# Patient Record
Sex: Male | Born: 1960
Health system: Southern US, Community
[De-identification: ages and names within clinical notes are randomized; demographics above are authoritative.]

## PROBLEM LIST (undated history)

## (undated) DIAGNOSIS — Z973 Presence of spectacles and contact lenses: Secondary | ICD-10-CM

## (undated) DIAGNOSIS — K5730 Diverticulosis of large intestine without perforation or abscess without bleeding: Secondary | ICD-10-CM

## (undated) DIAGNOSIS — E785 Hyperlipidemia, unspecified: Secondary | ICD-10-CM

## (undated) DIAGNOSIS — K219 Gastro-esophageal reflux disease without esophagitis: Secondary | ICD-10-CM

## (undated) DIAGNOSIS — I491 Atrial premature depolarization: Secondary | ICD-10-CM

## (undated) DIAGNOSIS — Z9889 Other specified postprocedural states: Secondary | ICD-10-CM

## (undated) DIAGNOSIS — Z85828 Personal history of other malignant neoplasm of skin: Secondary | ICD-10-CM

## (undated) DIAGNOSIS — Z136 Encounter for screening for cardiovascular disorders: Secondary | ICD-10-CM

## (undated) DIAGNOSIS — Z8719 Personal history of other diseases of the digestive system: Secondary | ICD-10-CM

## (undated) DIAGNOSIS — K4091 Unilateral inguinal hernia, without obstruction or gangrene, recurrent: Secondary | ICD-10-CM

## (undated) HISTORY — PX: COLONOSCOPY: SHX174

## (undated) HISTORY — DX: Gastro-esophageal reflux disease without esophagitis: K21.9

---

## 1998-01-11 HISTORY — PX: INGUINAL HERNIA REPAIR: SUR1180

## 2004-02-28 DIAGNOSIS — J018 Other acute sinusitis: Secondary | ICD-10-CM | POA: Insufficient documentation

## 2007-08-23 ENCOUNTER — Encounter: Payer: Self-pay | Admitting: Gastroenterology

## 2007-09-15 ENCOUNTER — Ambulatory Visit: Payer: Self-pay | Admitting: Gastroenterology

## 2007-09-29 ENCOUNTER — Encounter: Payer: Self-pay | Admitting: Gastroenterology

## 2007-09-29 ENCOUNTER — Ambulatory Visit: Payer: Self-pay | Admitting: Gastroenterology

## 2007-10-03 ENCOUNTER — Encounter: Payer: Self-pay | Admitting: Gastroenterology

## 2009-01-31 ENCOUNTER — Emergency Department (HOSPITAL_COMMUNITY): Admission: EM | Admit: 2009-01-31 | Discharge: 2009-01-31 | Payer: Self-pay | Admitting: Emergency Medicine

## 2009-08-26 ENCOUNTER — Ambulatory Visit: Payer: Self-pay | Admitting: Internal Medicine

## 2009-08-26 ENCOUNTER — Encounter: Payer: Self-pay | Admitting: Internal Medicine

## 2009-08-26 DIAGNOSIS — Z860100 Personal history of colon polyps, unspecified: Secondary | ICD-10-CM | POA: Insufficient documentation

## 2009-08-26 DIAGNOSIS — K219 Gastro-esophageal reflux disease without esophagitis: Secondary | ICD-10-CM | POA: Insufficient documentation

## 2009-08-26 LAB — CONVERTED CEMR LAB
BUN: 16 mg/dL (ref 6–23)
Basophils Relative: 0.5 % (ref 0.0–3.0)
CO2: 33 meq/L — ABNORMAL HIGH (ref 19–32)
Chloride: 101 meq/L (ref 96–112)
Direct LDL: 165.8 mg/dL
Eosinophils Absolute: 0.1 10*3/uL (ref 0.0–0.7)
Eosinophils Relative: 1.9 % (ref 0.0–5.0)
HDL: 47.3 mg/dL (ref >39.00)
Hemoglobin: 16.3 g/dL (ref 13.0–17.0)
Lymphocytes Relative: 26 % (ref 12.0–46.0)
Monocytes Relative: 5.1 % (ref 3.0–12.0)
Neutro Abs: 4.8 10*3/uL (ref 1.4–7.7)
Neutrophils Relative %: 66.5 % (ref 43.0–77.0)
Potassium: 4.8 meq/L (ref 3.5–5.1)
RBC: 5.2 M/uL (ref 4.22–5.81)
Total CHOL/HDL Ratio: 5
VLDL: 17 mg/dL (ref 0.0–40.0)
WBC: 7.3 10*3/uL (ref 4.5–10.5)

## 2010-02-10 NOTE — Letter (Signed)
Summary: Lipid Letter  [facility]   [place], [place] [address]   Phone: [phone]  Fax: [phone]    08/26/2009  Valin Massie [address] [place], [place]  [address]  Dear Alvester Chou:  We have carefully reviewed your last lipid profile from  and the results are noted below with a summary of recommendations for lipid management.    Cholesterol:       225     Goal: <200   HDL "good" Cholesterol:   47.30     Goal: >40 good news   LDL "bad" Cholesterol:   166     Goal: <130 bad news   Triglycerides:       85.0     Goal: <150    other labs look good, let me know if you want to start a cholesterol medicine    TLC Diet (Therapeutic Lifestyle Change): Saturated Fats & Transfatty acids should be kept < 7% of total calories ***Reduce Saturated Fats Polyunstaurated Fat can be up to 10% of total calories Monounsaturated Fat Fat can be up to 20% of total calories Total Fat should be no greater than 25-35% of total calories Carbohydrates should be 50-60% of total calories Protein should be approximately 15% of total calories Fiber should be at least 20-30 grams a day ***Increased fiber may help lower LDL Total Cholesterol should be < 258m/day Consider adding plant stanol/sterols to diet (example: Benacol spread) ***A higher intake of unsaturated fat may reduce Triglycerides and Increase HDL    Adjunctive Measures (may lower LIPIDS and reduce risk of Heart Attack) include: Aerobic Exercise (20-30 minutes 3-4 times a week) Limit Alcohol Consumption Weight Reduction Aspirin 75-81 mg a day by mouth (if not allergic or contraindicated) Dietary Fiber 20-30 grams a day by mouth     Current Medications: 1)    Asacol 400 Mg Tbec (Mesalamine) .... Take 1 tablet by mouth once a day 2)    Dexilant 60 Mg Cpdr (Dexlansoprazole) .... One by mouth once daily for heartburn  If you have any questions, please call. We appreciate being able to work with you.   Sincerely,      [facility] TJanith LimaMD

## 2010-02-10 NOTE — Assessment & Plan Note (Signed)
Summary: NEW PT CPX / LABS SAME DAY / NWS  #   Vital Signs:  Patient profile:   50 year old male Height:      73 inches Weight:      187.25 pounds BMI:     24.79 O2 Sat:      96 % on Room air Temp:     97.1 degrees F oral Pulse rate:   65 / minute Pulse rhythm:   regular Resp:     16 per minute BP sitting:   112 / 70  (left arm) Cuff size:   large  Vitals Entered By: Estell Harpin CMA (August 26, 2009 10:33 AM)  O2 Flow:  Room air CC: New pt CPX w/ labs, Heartburn Is Patient Diabetic? No Pain Assessment Patient in pain? no       Does patient need assistance? Functional Status Self care Ambulation Normal   Primary Care [facility]:  Janith Lima MD  CC:  New pt CPX w/ labs and Heartburn.  History of Present Illness:  Heartburn      This is a 49 year old man who presents with Heartburn.  The symptoms began 4-8 weeks ago.  The intensity is described as mild.  The patient reports acid reflux, but denies sour taste in mouth, epigastric pain, chest pain, trouble swallowing, weight loss, and weight gain.  The patient denies the following alarm features: melena, dysphagia, hematemesis, vomiting, involuntary weight loss >5%, and history of anemia.  Symptoms are worse with spicy foods.  Prior evaluation has included no diagnostic studies.  Treatment that was tried and either found to be ineffective or stopped due to problems include dietary changes and an antacid.    Preventive Screening-Counseling & Management  Alcohol-Tobacco     Alcohol drinks/day: <1     Alcohol type: all     >5/day in last 3 mos: no     Alcohol Counseling: not indicated; use of alcohol is not excessive or problematic     Feels need to cut down: no     Feels annoyed by complaints: no     Feels guilty re: drinking: no     Needs 'eye opener' in am: no     Smoking Status: never  Caffeine-Diet-Exercise     Does Patient Exercise: yes  Hep-HIV-STD-Contraception     Hepatitis Risk: no risk noted  HIV Risk: no risk noted     STD Risk: no risk noted     TSE monthly: yes     Testicular SE Education/Counseling to perform regular STE  Safety-Violence-Falls     Seat Belt Use: yes     Helmet Use: yes     Firearms in the Home: no firearms in the home     Smoke Detectors: yes     Violence in the Home: no risk noted     Sexual Abuse: no      Sexual History:  currently monogamous.        Drug Use:  never and no.        Blood Transfusions:  no.    Medications Prior to Update: 1)  Miralax   Powd (Polyethylene Glycol 3350) .... As  Directed 2)  Reglan 10 Mg  Tabs (Metoclopramide Hcl) .... As Directed 3)  Dulcolax 5 Mg  Tbec (Bisacodyl) .... As Directed  Current Medications (verified): 1)  Asacol 400 Mg Tbec (Mesalamine) .... Take 1 Tablet By Mouth Once A Day 2)  Dexilant 60 Mg Cpdr (Dexlansoprazole) .... One  By Mouth Once Daily For Heartburn  Allergies (verified): No Known Drug Allergies  Past History:  Past Medical History: Colonic polyps, hx of Ulcerative colitis  Past Surgical History: Denies surgical history  Family History: father had CABG at 10 yoa and he had leukemia  Social History: Alcohol use-yes Drug use-no Regular exercise-yes Separated Occupation: CFO Drug Use:  never, no Does Patient Exercise:  yes Education:  Administrator, arts Use:  yes Alcohol:  Less than 3 drinks per week Smoking Status:  never Hepatitis Risk:  no risk noted HIV Risk:  no risk noted STD Risk:  no risk noted Sexual History:  currently monogamous Blood Transfusions:  no  Review of Systems       The patient complains of severe indigestion/heartburn.  The patient denies anorexia, fever, weight loss, weight gain, chest pain, syncope, dyspnea on exertion, peripheral edema, prolonged cough, headaches, hemoptysis, abdominal pain, melena, hematochezia, hematuria, muscle weakness, suspicious skin lesions, difficulty walking, depression, abnormal bleeding, enlarged lymph nodes,  angioedema, and testicular masses.   GI:  Complains of gas; denies abdominal pain, bloody stools, change in bowel habits, constipation, dark tarry stools, diarrhea, excessive appetite, hemorrhoids, indigestion, loss of appetite, nausea, vomiting, vomiting blood, and yellowish skin color.  Physical Exam  General:  alert, well-developed, well-nourished, well-hydrated, appropriate dress, normal appearance, healthy-appearing, cooperative to examination, and good hygiene.   Head:  normocephalic, atraumatic, no abnormalities observed, no abnormalities palpated, and no alopecia.   Eyes:  vision grossly intact, pupils equal, pupils round, and pupils reactive to light.   Ears:  R ear normal and L ear normal.   Nose:  External nasal examination shows no deformity or inflammation. Nasal mucosa are pink and moist without lesions or exudates. Mouth:  Oral mucosa and oropharynx without lesions or exudates.  Teeth in good repair. Neck:  supple, full ROM, no masses, no thyromegaly, no thyroid nodules or tenderness, no JVD, normal carotid upstroke, no carotid bruits, no cervical lymphadenopathy, and no neck tenderness.   Lungs:  normal respiratory effort, no intercostal retractions, no accessory muscle use, normal breath sounds, no dullness, no fremitus, no crackles, and no wheezes.   Heart:  normal rate, regular rhythm, no murmur, no gallop, no rub, and no JVD.   Abdomen:  soft, non-tender, normal bowel sounds, no distention, no masses, no guarding, no rigidity, no rebound tenderness, no abdominal hernia, no inguinal hernia, no hepatomegaly, and no splenomegaly.   Genitalia:  circumcised, no hydrocele, no varicocele, no scrotal masses, no testicular masses or atrophy, no cutaneous lesions, and no urethral discharge.   Msk:  normal ROM, no joint tenderness, no joint swelling, no joint warmth, no redness over joints, no joint deformities, no joint instability, no crepitation, and no muscle atrophy.   Pulses:  R and L  carotid,radial,femoral,dorsalis pedis and posterior tibial pulses are full and equal bilaterally Extremities:  No clubbing, cyanosis, edema, or deformity noted with normal full range of motion of all joints.   Neurologic:  No cranial nerve deficits noted. Station and gait are normal. Plantar reflexes are down-going bilaterally. DTRs are symmetrical throughout. Sensory, motor and coordinative functions appear intact. Skin:  turgor normal, color normal, no rashes, no suspicious lesions, no ecchymoses, no petechiae, no purpura, no ulcerations, and no edema.   Cervical Nodes:  no anterior cervical adenopathy and no posterior cervical adenopathy.   Axillary Nodes:  no R axillary adenopathy and no L axillary adenopathy.   Inguinal Nodes:  no R inguinal adenopathy and no L inguinal  adenopathy.   Psych:  Cognition and judgment appear intact. Alert and cooperative with normal attention span and concentration. No apparent delusions, illusions, hallucinations   Impression & Recommendations:  Problem # 1:  GERD (ICD-530.81) Assessment New  His updated medication list for this problem includes:    Dexilant 60 Mg Cpdr (Dexlansoprazole) ..... One by mouth once daily for heartburn  Orders: Venipuncture (38937) TLB-Lipid Panel (80061-LIPID) TLB-CBC Platelet - w/Differential (85025-CBCD) TLB-BMP (Basic Metabolic Panel-BMET) (34287-GOTLXBW)  Problem # 2:  ROUTINE GENERAL MEDICAL EXAM@HEALTH  CARE FACL (ICD-V70.0)  Orders: Venipuncture (62035) TLB-Lipid Panel (80061-LIPID) TLB-CBC Platelet - w/Differential (85025-CBCD) TLB-BMP (Basic Metabolic Panel-BMET) (59741-ULAGTXM) EKG w/ Interpretation (93000)  Colonoscopy: Location:  [facility].   (09/29/2007) Next Colonoscopy due:: 10/2010 (09/29/2007)  Discussed using sunscreen, use of alcohol, drug use, self testicular exam, routine dental care, routine eye care, routine physical exam, seat belts, multiple vitamins, and recommendations for  immunizations.  Discussed exercise and checking cholesterol.    Complete Medication List: 1)  Asacol 400 Mg Tbec (Mesalamine) .... Take 1 tablet by mouth once a day 2)  Dexilant 60 Mg Cpdr (Dexlansoprazole) .... One by mouth once daily for heartburn  Patient Instructions: 1)  Please schedule a follow-up appointment in 1 month. 2)  Avoid foods high in acid (tomatoes, citrus juices, spicy foods). Avoid eating within two hours of lying down or before exercising. Do not over eat; try smaller more frequent meals. Elevate head of bed twelve inches when sleeping. Prescriptions: DEXILANT 60 MG CPDR (DEXLANSOPRAZOLE) One by mouth once daily for heartburn  #15 x 0   Entered and Authorized by:   Janith Lima MD   Signed by:   Janith Lima MD on 08/26/2009   Method used:   Samples Given   RxID:   [id number]

## 2010-03-30 ENCOUNTER — Ambulatory Visit: Payer: Self-pay | Admitting: Internal Medicine

## 2010-04-02 ENCOUNTER — Encounter: Payer: Self-pay | Admitting: Internal Medicine

## 2010-04-02 ENCOUNTER — Ambulatory Visit (INDEPENDENT_AMBULATORY_CARE_PROVIDER_SITE_OTHER): Payer: BC Managed Care – PPO | Admitting: Internal Medicine

## 2010-04-02 ENCOUNTER — Other Ambulatory Visit (INDEPENDENT_AMBULATORY_CARE_PROVIDER_SITE_OTHER): Payer: BC Managed Care – PPO | Admitting: Internal Medicine

## 2010-04-02 ENCOUNTER — Other Ambulatory Visit (INDEPENDENT_AMBULATORY_CARE_PROVIDER_SITE_OTHER): Payer: BC Managed Care – PPO

## 2010-04-02 VITALS — BP 118/72 | HR 68 | Temp 98.5°F | Resp 14 | Wt 192.0 lb

## 2010-04-02 DIAGNOSIS — R5383 Other fatigue: Secondary | ICD-10-CM | POA: Insufficient documentation

## 2010-04-02 DIAGNOSIS — K5090 Crohn's disease, unspecified, without complications: Secondary | ICD-10-CM

## 2010-04-02 DIAGNOSIS — K219 Gastro-esophageal reflux disease without esophagitis: Secondary | ICD-10-CM

## 2010-04-02 DIAGNOSIS — R5381 Other malaise: Secondary | ICD-10-CM

## 2010-04-02 LAB — CBC WITH DIFFERENTIAL/PLATELET
Basophils Relative: 1 % (ref 0.0–3.0)
Eosinophils Absolute: 0.2 10*3/uL (ref 0.0–0.7)
Hemoglobin: 14.9 g/dL (ref 13.0–17.0)
MCHC: 34.8 g/dL (ref 30.0–36.0)
MCV: 90.1 fl (ref 78.0–100.0)
Monocytes Absolute: 0.4 10*3/uL (ref 0.1–1.0)
Neutro Abs: 5.2 10*3/uL (ref 1.4–7.7)
RBC: 4.76 Mil/uL (ref 4.22–5.81)

## 2010-04-02 LAB — COMPREHENSIVE METABOLIC PANEL
AST: 18 U/L (ref 0–37)
Alkaline Phosphatase: 54 U/L (ref 39–117)
BUN: 15 mg/dL (ref 6–23)
Calcium: 9 mg/dL (ref 8.4–10.5)
Chloride: 100 mEq/L (ref 96–112)
Creatinine, Ser: 1 mg/dL (ref 0.4–1.5)

## 2010-04-02 LAB — SEDIMENTATION RATE: Sed Rate: 10 mm/hr (ref 0–22)

## 2010-04-02 LAB — TSH: TSH: 1.39 u[IU]/mL (ref 0.35–5.50)

## 2010-04-02 MED ORDER — OMEPRAZOLE 40 MG PO CPDR: 40.0000 mg | DELAYED_RELEASE_CAPSULE | Freq: Every day | ORAL | Status: DC

## 2010-04-02 MED ORDER — MESALAMINE 400 MG PO TBEC: 400.0000 mg | DELAYED_RELEASE_TABLET | Freq: Every day | ORAL | Status: DC

## 2010-04-02 NOTE — Progress Notes (Signed)
  Subjective:    Patient ID: Daniel Allen, male    DOB: 1960-10-24, 50 y.o.   MRN: [id number]  HPI He returns c/o one week history of belching and abd bloating, he has not been taking his meds for several months. He has not had any diarrhea or bloody stools.    Review of Systems  Constitutional: Positive for fatigue. Negative for fever, chills, diaphoresis, activity change, appetite change and unexpected weight change.  HENT: Negative for neck pain.   Respiratory: Negative for cough, choking, shortness of breath, wheezing and stridor.   Cardiovascular: Negative for chest pain, palpitations and leg swelling.  Gastrointestinal: Negative for nausea, abdominal pain, diarrhea, constipation, blood in stool, abdominal distention, anal bleeding and rectal pain.  Genitourinary: Negative for dysuria, urgency, frequency, hematuria, decreased urine volume, difficulty urinating and penile pain.  Musculoskeletal: Negative for myalgias, joint swelling and arthralgias.  Skin: Negative for color change and pallor.  Neurological: Negative for dizziness, tremors, weakness, light-headedness, numbness and headaches.  Hematological: Negative for adenopathy. Does not bruise/bleed easily.  Psychiatric/Behavioral: Negative for hallucinations, behavioral problems, confusion, dysphoric mood, decreased concentration and agitation.     Past Medical History  Diagnosis Date  . Ulcerative colitis   . Hx of colonic polyps    No past surgical history on file.  reports that he has never smoked. He does not have any smokeless tobacco history on file. He reports that he drinks alcohol. He reports that he does not use illicit drugs. family history includes Cancer in his father. No Known Allergies     Objective:   Physical Exam  Constitutional: He is oriented to person, place, and time. He appears well-developed and well-nourished. No distress.  HENT:  Head: Atraumatic.  Right Ear: External ear normal.  Left Ear:  External ear normal.  Nose: Nose normal.  Mouth/Throat: No oropharyngeal exudate.  Eyes: Conjunctivae and EOM are normal. Pupils are equal, round, and reactive to light. Right eye exhibits no discharge. Left eye exhibits no discharge. No scleral icterus.  Neck: Normal range of motion. Neck supple. No thyromegaly present.  Cardiovascular: Normal rate, regular rhythm and intact distal pulses.  Exam reveals no gallop and no friction rub.   No murmur heard. Pulmonary/Chest: Effort normal and breath sounds normal. No respiratory distress. He has no wheezes. He has no rales. He exhibits no tenderness.  Abdominal: He exhibits no distension and no mass. There is no tenderness. There is no rebound and no guarding.  Musculoskeletal: He exhibits no edema and no tenderness.  Lymphadenopathy:    He has no cervical adenopathy.  Neurological: He is oriented to person, place, and time. He has normal reflexes. No cranial nerve deficit. He exhibits normal muscle tone. Coordination normal.  Skin: Skin is warm and dry. No rash noted. He is not diaphoretic. No erythema. No pallor.  Psychiatric: He has a normal mood and affect. His behavior is normal. Judgment and thought content normal.          Assessment & Plan:

## 2010-04-02 NOTE — Patient Instructions (Signed)
Esophagitis (Heartburn) Esophagitis (heartburn) is a painful, burning sensation in the chest. It may feel worse in certain positions, such as lying down or bending over. It is caused by stomach acid backing up into the tube that carries food from the mouth down to the stomach (lower esophagus). TREATMENT There are a number of non-prescription medicines used to treat heartburn, including:  Antacids.   Acid reducers (also called H-2 blockers).   Proton-pump inhibitors.  HOME CARE INSTRUCTIONS  Raise the head of your bead by putting blocks under the legs.   Eat 2-3 hours before going to bed.   Stop smoking.   Try to reach and maintain a healthy weight.   Do not eat just a few very large meals. Instead, eat many smaller meals throughout the day.   Try to identify foods and beverages that make your symptoms worse, and avoid these.   Avoid tight clothing.   Do not exercise right after eating.  SEEK IMMEDIATE MEDICAL CARE IF YOU:  Have severe chest pain that goes down your arm, or into your jaw or neck.   Feel sweaty, dizzy, or lightheaded.   Are short of breath.   Throw up (vomit) blood.   Have difficulty or pain with swallowing.   Have bloody or black, tarry stools.   Have bouts of heartburn more than three times a week for more than two weeks.  Document Released: 02/05/2004 Document Re-Released: 03/24/2009 [facility] Patient Information 2011 [place].

## 2010-04-03 ENCOUNTER — Encounter: Payer: Self-pay | Admitting: Internal Medicine

## 2010-05-15 ENCOUNTER — Ambulatory Visit: Payer: BC Managed Care – PPO | Admitting: Gastroenterology

## 2010-09-07 ENCOUNTER — Other Ambulatory Visit: Payer: Self-pay | Admitting: Internal Medicine

## 2010-10-19 ENCOUNTER — Telehealth: Payer: Self-pay | Admitting: Gastroenterology

## 2010-10-19 NOTE — Telephone Encounter (Signed)
Pt was due for colon recall, Dr. Deatra Ina reviewed the chart and states that the pt no longer needs the colon at this time. Pt is calling wanting to know why he doesn't need the colon now. Dr. Deatra Ina please advise.

## 2010-10-20 NOTE — Telephone Encounter (Signed)
Pt is having bleeding. Therefore proceed with scheduling colo

## 2010-10-20 NOTE — Telephone Encounter (Signed)
Spoke with pt and he states he is not having any bleeding. He was under the impression he was supposed to have a colon every 3 years. Last colon done 09/29/07. Pt wants to know if he no longer needs the colon q 3 years.........Marland KitchenIf not he would like to know why.  Please advise.

## 2010-10-21 NOTE — Telephone Encounter (Signed)
Pt scheduled for previsit for 11/04/10@8 :30am, Colon scheduled with Dr. Deatra Ina for 11/06/10@1 :30pm. Pt aware of appt dates and times.

## 2010-10-21 NOTE — Telephone Encounter (Signed)
With h/o ulcerative colitis surveillance colonoscopy is recommended b/c of increased risk for colon cancer. Rec f/u colo now

## 2010-10-21 NOTE — Telephone Encounter (Signed)
Left message for pt to call back  °

## 2010-11-04 ENCOUNTER — Ambulatory Visit (AMBULATORY_SURGERY_CENTER): Payer: BC Managed Care – PPO | Admitting: *Deleted

## 2010-11-04 ENCOUNTER — Other Ambulatory Visit: Payer: BC Managed Care – PPO | Admitting: Gastroenterology

## 2010-11-04 DIAGNOSIS — K5190 Ulcerative colitis, unspecified, without complications: Secondary | ICD-10-CM

## 2010-11-04 MED ORDER — PEG-KCL-NACL-NASULF-NA ASC-C 100 G PO SOLR: 1.0000 | Freq: Once | ORAL | Status: DC

## 2010-11-04 NOTE — Progress Notes (Signed)
While going over prep instructions, pt  States he needs to change appointment date.  He would be unable to prep on 11-05-10 (day before his procedure).  Appointment changed to 11-19-10 at 2:00 p.m.  Prep instructions given with new date and time and understanding voiced

## 2010-11-06 ENCOUNTER — Other Ambulatory Visit: Payer: BC Managed Care – PPO | Admitting: Gastroenterology

## 2010-11-19 ENCOUNTER — Encounter: Payer: Self-pay | Admitting: Gastroenterology

## 2010-11-19 ENCOUNTER — Ambulatory Visit (AMBULATORY_SURGERY_CENTER): Payer: BC Managed Care – PPO | Admitting: Gastroenterology

## 2010-11-19 VITALS — BP 134/87 | HR 56 | Temp 97.5°F | Resp 18 | Ht 73.0 in | Wt 193.0 lb

## 2010-11-19 DIAGNOSIS — Z86010 Personal history of colonic polyps: Secondary | ICD-10-CM

## 2010-11-19 DIAGNOSIS — K5090 Crohn's disease, unspecified, without complications: Secondary | ICD-10-CM

## 2010-11-19 DIAGNOSIS — K5289 Other specified noninfective gastroenteritis and colitis: Secondary | ICD-10-CM

## 2010-11-19 DIAGNOSIS — K5190 Ulcerative colitis, unspecified, without complications: Secondary | ICD-10-CM

## 2010-11-19 LAB — HM COLONOSCOPY

## 2010-11-19 MED ORDER — SODIUM CHLORIDE 0.9 % IV SOLN: 500.0000 mL | INTRAVENOUS | Status: DC

## 2010-11-19 NOTE — Patient Instructions (Signed)
Resume medications. Information given on diverticulosis,proctitis. D/C instructions completed. Call office to schedule follow appt. With Dr. Deatra Ina for 4 weeks.

## 2010-11-20 ENCOUNTER — Telehealth: Payer: Self-pay | Admitting: *Deleted

## 2010-11-20 NOTE — Telephone Encounter (Signed)

## 2010-12-17 ENCOUNTER — Encounter: Payer: Self-pay | Admitting: Gastroenterology

## 2010-12-17 ENCOUNTER — Ambulatory Visit (INDEPENDENT_AMBULATORY_CARE_PROVIDER_SITE_OTHER): Payer: BC Managed Care – PPO | Admitting: Gastroenterology

## 2010-12-17 VITALS — BP 100/76 | HR 60 | Ht 73.0 in | Wt 191.2 lb

## 2010-12-17 DIAGNOSIS — K5090 Crohn's disease, unspecified, without complications: Secondary | ICD-10-CM

## 2010-12-17 NOTE — Assessment & Plan Note (Addendum)
He remains in clinical remission on maintenance asacol. Recommendations #1 continuous cycle #2 yearly colonoscopy because of the higher risk for colon cancer in the face of 15 year history of Crohn's colitis

## 2010-12-17 NOTE — Progress Notes (Signed)
History of Present Illness:  Daniel Allen returned following colonoscopy for Crohn's colitis. He had Crohn's disease for over 15 years and has remained in remission with asacol. He underwent surveillance colonoscopy recently for colorectal cancer screeing. Biopsies demonstrated chronic inflammatory changes only. He has no GI complaints.    Review of Systems: Pertinent positive and negative review of systems were noted in the above HPI section. All other review of systems were otherwise negative.    Current Medications, Allergies, Past Medical History, Past Surgical History, Family History and Social History were reviewed in [facility] record  Vital signs were reviewed in today's medical record. Physical Exam: General: Well developed , well nourished, no acute distress

## 2010-12-17 NOTE — Patient Instructions (Signed)
Follow up as needed

## 2011-01-06 ENCOUNTER — Other Ambulatory Visit: Payer: Self-pay | Admitting: Internal Medicine

## 2011-03-01 ENCOUNTER — Other Ambulatory Visit: Payer: Self-pay | Admitting: Internal Medicine

## 2011-08-16 ENCOUNTER — Ambulatory Visit: Payer: BC Managed Care – PPO | Admitting: Internal Medicine

## 2011-08-16 DIAGNOSIS — Z0289 Encounter for other administrative examinations: Secondary | ICD-10-CM

## 2011-08-18 ENCOUNTER — Encounter: Payer: Self-pay | Admitting: Internal Medicine

## 2011-08-18 ENCOUNTER — Ambulatory Visit (INDEPENDENT_AMBULATORY_CARE_PROVIDER_SITE_OTHER): Payer: BC Managed Care – PPO | Admitting: Internal Medicine

## 2011-08-18 ENCOUNTER — Other Ambulatory Visit (INDEPENDENT_AMBULATORY_CARE_PROVIDER_SITE_OTHER): Payer: BC Managed Care – PPO

## 2011-08-18 VITALS — BP 100/70 | HR 69 | Temp 97.9°F | Resp 16 | Wt 189.0 lb

## 2011-08-18 DIAGNOSIS — Z23 Encounter for immunization: Secondary | ICD-10-CM

## 2011-08-18 DIAGNOSIS — IMO0001 Reserved for inherently not codable concepts without codable children: Secondary | ICD-10-CM

## 2011-08-18 DIAGNOSIS — Z0000 Encounter for general adult medical examination without abnormal findings: Secondary | ICD-10-CM

## 2011-08-18 DIAGNOSIS — R5383 Other fatigue: Secondary | ICD-10-CM

## 2011-08-18 DIAGNOSIS — Z136 Encounter for screening for cardiovascular disorders: Secondary | ICD-10-CM

## 2011-08-18 DIAGNOSIS — I491 Atrial premature depolarization: Secondary | ICD-10-CM

## 2011-08-18 DIAGNOSIS — R5381 Other malaise: Secondary | ICD-10-CM

## 2011-08-18 DIAGNOSIS — R079 Chest pain, unspecified: Secondary | ICD-10-CM | POA: Insufficient documentation

## 2011-08-18 LAB — TSH: TSH: 1.92 u[IU]/mL (ref 0.35–5.50)

## 2011-08-18 LAB — URINALYSIS, ROUTINE W REFLEX MICROSCOPIC
Bilirubin Urine: NEGATIVE
Hgb urine dipstick: NEGATIVE
Ketones, ur: NEGATIVE
Leukocytes, UA: NEGATIVE
Nitrite: NEGATIVE
Urobilinogen, UA: 0.2 (ref 0.0–1.0)

## 2011-08-18 LAB — CBC WITH DIFFERENTIAL/PLATELET
Basophils Absolute: 0 10*3/uL (ref 0.0–0.1)
Eosinophils Absolute: 0.2 10*3/uL (ref 0.0–0.7)
HCT: 46.9 % (ref 39.0–52.0)
Hemoglobin: 15.8 g/dL (ref 13.0–17.0)
Lymphs Abs: 1.2 10*3/uL (ref 0.7–4.0)
MCHC: 33.6 g/dL (ref 30.0–36.0)
MCV: 91 fl (ref 78.0–100.0)
Monocytes Absolute: 0.3 10*3/uL (ref 0.1–1.0)
Neutro Abs: 4.9 10*3/uL (ref 1.4–7.7)
RDW: 13.1 % (ref 11.5–14.6)

## 2011-08-18 LAB — CARDIAC PANEL
CK-MB: 1.6 ng/mL (ref 0.3–4.0)
Relative Index: 0.9 calc (ref 0.0–2.5)

## 2011-08-18 LAB — COMPREHENSIVE METABOLIC PANEL
ALT: 20 U/L (ref 0–53)
AST: 23 U/L (ref 0–37)
Creatinine, Ser: 0.9 mg/dL (ref 0.4–1.5)
Total Bilirubin: 0.9 mg/dL (ref 0.3–1.2)

## 2011-08-18 LAB — D-DIMER, QUANTITATIVE: D-Dimer, Quant: 0.3 ug/mL-FEU (ref 0.00–0.48)

## 2011-08-18 LAB — LIPID PANEL
HDL: 44.8 mg/dL (ref >39.00)
Total CHOL/HDL Ratio: 5
Triglycerides: 121 mg/dL (ref 0.0–149.0)
VLDL: 24.2 mg/dL (ref 0.0–40.0)

## 2011-08-18 LAB — TROPONIN I: Troponin I: <0.01 ng/mL (ref <0.06)

## 2011-08-18 LAB — FECAL OCCULT BLOOD, GUAIAC: Fecal Occult Blood: NEGATIVE

## 2011-08-18 LAB — LDL CHOLESTEROL, DIRECT: Direct LDL: 142.3 mg/dL

## 2011-08-18 NOTE — Assessment & Plan Note (Signed)
This is a chronic problem for him that does not appear to cause any symptoms for him, I do not think this is causing his pain or any other symptoms for him, if the labs show evidence of CHF or ischemia then I will refer him to cardiology.

## 2011-08-18 NOTE — Assessment & Plan Note (Addendum)
His EKG shows PAC's but no signs of ischemia or tachycardia, he tells me that he has had PAC's for many years - previously diagnosed by a doctor in [place], I will check his labs to look for PE, CHF, cardiac ischemia, anemia, etc

## 2011-08-18 NOTE — Assessment & Plan Note (Signed)
Exam done, vaccines were updated, labs were ordered, pt ed material was given

## 2011-08-18 NOTE — Progress Notes (Signed)
Subjective:    Patient ID: Daniel Allen, male    DOB: 30-Nov-1960, 51 y.o.   MRN: [id number]  Chest Pain  This is a new problem. The current episode started in the past 7 days. The onset quality is gradual. The problem occurs intermittently. The problem has been unchanged. The pain is present in the substernal region. The pain is at a severity of 1/10. The pain is mild. Quality: dull aching. The pain does not radiate. Associated symptoms include leg pain, malaise/fatigue and weakness. Pertinent negatives include no abdominal pain, back pain, claudication, cough, diaphoresis, dizziness, exertional chest pressure, fever, headaches, hemoptysis, irregular heartbeat, lower extremity edema, nausea, near-syncope, numbness, orthopnea, palpitations, PND, shortness of breath, sputum production, syncope or vomiting.  Pertinent negatives for past medical history include no seizures.      Review of Systems  Constitutional: Positive for malaise/fatigue and fatigue. Negative for fever, chills, diaphoresis, activity change, appetite change and unexpected weight change.  HENT: Negative.   Eyes: Negative.   Respiratory: Negative for apnea, cough, hemoptysis, sputum production, choking, chest tightness, shortness of breath, wheezing and stridor.   Cardiovascular: Positive for chest pain. Negative for palpitations, orthopnea, claudication, leg swelling, syncope, PND and near-syncope.  Gastrointestinal: Negative for nausea, vomiting, abdominal pain, diarrhea, constipation, blood in stool, abdominal distention and anal bleeding.  Genitourinary: Negative.   Musculoskeletal: Positive for myalgias. Negative for back pain, joint swelling, arthralgias and gait problem.  Skin: Negative for color change, pallor, rash and wound.  Neurological: Positive for weakness. Negative for dizziness, tremors, seizures, syncope, facial asymmetry, speech difficulty, light-headedness, numbness and headaches.  Hematological: Negative for  adenopathy. Does not bruise/bleed easily.  Psychiatric/Behavioral: Negative.        Objective:   Physical Exam  Vitals reviewed. Constitutional: He is oriented to person, place, and time. He appears well-developed and well-nourished.  Non-toxic appearance. He does not have a sickly appearance. He does not appear ill. No distress.  HENT:  Head: Normocephalic and atraumatic.  Mouth/Throat: Oropharynx is clear and moist. No oropharyngeal exudate.  Eyes: Conjunctivae are normal. Right eye exhibits no discharge. Left eye exhibits no discharge. No scleral icterus.  Neck: Normal range of motion. Neck supple. No JVD present. No tracheal deviation present. No thyromegaly present.  Cardiovascular: Normal rate, S1 normal, S2 normal, normal heart sounds and intact distal pulses.  An irregularly irregular rhythm present.  Extrasystoles are present. Exam reveals no gallop, no S3, no S4 and no friction rub.   No murmur heard.  No systolic murmur is present   No diastolic murmur is present  Pulses:      Carotid pulses are 1+ on the right side, and 1+ on the left side.      Radial pulses are 1+ on the right side, and 1+ on the left side.       Femoral pulses are 1+ on the right side, and 1+ on the left side.      Popliteal pulses are 1+ on the right side, and 1+ on the left side.       Dorsalis pedis pulses are 1+ on the right side, and 1+ on the left side.       Posterior tibial pulses are 1+ on the right side, and 1+ on the left side.  Pulmonary/Chest: Effort normal and breath sounds normal. No stridor. No respiratory distress. He has no wheezes. He has no rales. He exhibits no tenderness.  Abdominal: Soft. Bowel sounds are normal. He exhibits no distension and no  mass. There is no tenderness. There is no rebound and no guarding. Hernia confirmed negative in the right inguinal area and confirmed negative in the left inguinal area.  Genitourinary: Rectum normal, prostate normal, testes normal and penis  normal. Rectal exam shows no external hemorrhoid, no internal hemorrhoid, no fissure, no mass, no tenderness and anal tone normal. Guaiac negative stool. Prostate is not enlarged and not tender. Right testis shows no mass, no swelling and no tenderness. Right testis is descended. Left testis shows no mass, no swelling and no tenderness. Left testis is descended. Circumcised. No penile tenderness. No discharge found.  Musculoskeletal: Normal range of motion. He exhibits no edema and no tenderness.  Lymphadenopathy:    He has no cervical adenopathy.       Right: No inguinal adenopathy present.       Left: No inguinal adenopathy present.  Neurological: He is oriented to person, place, and time.  Skin: Skin is warm and dry. No rash noted. He is not diaphoretic. No erythema. No pallor.  Psychiatric: He has a normal mood and affect. His behavior is normal. Judgment and thought content normal.      Lab Results  Component Value Date   WBC 8.0 04/02/2010   HGB 14.9 04/02/2010   HCT 42.9 04/02/2010   PLT 212.0 04/02/2010   GLUCOSE 90 04/02/2010   CHOL 225* 08/26/2009   TRIG 85.0 08/26/2009   HDL 47.30 08/26/2009   LDLDIRECT 165.8 08/26/2009   ALT 18 04/02/2010   AST 18 04/02/2010   NA 137 04/02/2010   K 4.5 04/02/2010   CL 100 04/02/2010   CREATININE 1.0 04/02/2010   BUN 15 04/02/2010   CO2 28 04/02/2010   TSH 1.39 04/02/2010      Assessment & Plan:

## 2011-08-18 NOTE — Patient Instructions (Signed)
Chest Pain (Nonspecific) It is often hard to give a specific diagnosis for the cause of chest pain. There is always a chance that your pain could be related to something serious, such as a heart attack or a blood clot in the lungs. You need to follow up with your caregiver for further evaluation. CAUSES   Heartburn.   Pneumonia or bronchitis.   Anxiety or stress.   Inflammation around your heart (pericarditis) or lung (pleuritis or pleurisy).   A blood clot in the lung.   A collapsed lung (pneumothorax). It can develop suddenly on its own (spontaneous pneumothorax) or from injury (trauma) to the chest.   Shingles infection (herpes zoster virus).  The chest wall is composed of bones, muscles, and cartilage. Any of these can be the source of the pain.  The bones can be bruised by injury.   The muscles or cartilage can be strained by coughing or overwork.   The cartilage can be affected by inflammation and become sore (costochondritis).  DIAGNOSIS  Lab tests or other studies, such as X-rays, electrocardiography, stress testing, or cardiac imaging, may be needed to find the cause of your pain.  TREATMENT   Treatment depends on what may be causing your chest pain. Treatment may include:   Acid blockers for heartburn.   Anti-inflammatory medicine.   Pain medicine for inflammatory conditions.   Antibiotics if an infection is present.   You may be advised to change lifestyle habits. This includes stopping smoking and avoiding alcohol, caffeine, and chocolate.   You may be advised to keep your head raised (elevated) when sleeping. This reduces the chance of acid going backward from your stomach into your esophagus.   Most of the time, nonspecific chest pain will improve within 2 to 3 days with rest and mild pain medicine.  HOME CARE INSTRUCTIONS   If antibiotics were prescribed, take your antibiotics as directed. Finish them even if you start to feel better.   For the next few  days, avoid physical activities that bring on chest pain. Continue physical activities as directed.   Do not smoke.   Avoid drinking alcohol.   Only take over-the-counter or prescription medicine for pain, discomfort, or fever as directed by your caregiver.   Follow your caregiver's suggestions for further testing if your chest pain does not go away.   Keep any follow-up appointments you made. If you do not go to an appointment, you could develop lasting (chronic) problems with pain. If there is any problem keeping an appointment, you must call to reschedule.  SEEK MEDICAL CARE IF:   You think you are having problems from the medicine you are taking. Read your medicine instructions carefully.   Your chest pain does not go away, even after treatment.   You develop a rash with blisters on your chest.  SEEK IMMEDIATE MEDICAL CARE IF:   You have increased chest pain or pain that spreads to your arm, neck, jaw, back, or abdomen.   You develop shortness of breath, an increasing cough, or you are coughing up blood.   You have severe back or abdominal pain, feel nauseous, or vomit.   You develop severe weakness, fainting, or chills.   You have a fever.  THIS IS AN EMERGENCY. Do not wait to see if the pain will go away. Get medical help at once. Call your local emergency services (911 in [place] Do not drive yourself to the hospital. MAKE SURE YOU:   Understand these instructions.  Will watch your condition.   Will get help right away if you are not doing well or get worse.  Document Released: 10/07/2004 Document Revised: 12/17/2010 Document Reviewed: 08/03/2007 [facility] Patient Information 2012 [place].Health Maintenance, Males A healthy lifestyle and preventative care can promote health and wellness.  Maintain regular health, dental, and eye exams.   Eat a healthy diet. Foods like vegetables, fruits, whole grains, low-fat dairy products, and lean protein foods contain the  nutrients you need without too many calories. Decrease your intake of foods high in solid fats, added sugars, and salt. Get information about a proper diet from your caregiver, if necessary.   Regular physical exercise is one of the most important things you can do for your health. Most adults should get at least 150 minutes of moderate-intensity exercise (any activity that increases your heart rate and causes you to sweat) each week. In addition, most adults need muscle-strengthening exercises on 2 or more days a week.    Maintain a healthy weight. The body mass index (BMI) is a screening tool to identify possible weight problems. It provides an estimate of body fat based on height and weight. Your caregiver can help determine your BMI, and can help you achieve or maintain a healthy weight. For adults 20 years and older:   A BMI below 18.5 is considered underweight.   A BMI of 18.5 to 24.9 is normal.   A BMI of 25 to 29.9 is considered overweight.   A BMI of 30 and above is considered obese.   Maintain normal blood lipids and cholesterol by exercising and minimizing your intake of saturated fat. Eat a balanced diet with plenty of fruits and vegetables. Blood tests for lipids and cholesterol should begin at age 78 and be repeated every 5 years. If your lipid or cholesterol levels are high, you are over 50, or you are a high risk for heart disease, you may need your cholesterol levels checked more frequently.Ongoing high lipid and cholesterol levels should be treated with medicines, if diet and exercise are not effective.   If you smoke, find out from your caregiver how to quit. If you do not use tobacco, do not start.   If you choose to drink alcohol, do not exceed 2 drinks per day. One drink is considered to be 12 ounces (355 mL) of beer, 5 ounces (148 mL) of wine, or 1.5 ounces (44 mL) of liquor.   Avoid use of street drugs. Do not share needles with anyone. Ask for help if you need support  or instructions about stopping the use of drugs.   High blood pressure causes heart disease and increases the risk of stroke. Blood pressure should be checked at least every 1 to 2 years. Ongoing high blood pressure should be treated with medicines if weight loss and exercise are not effective.   If you are 43 to 51 years old, ask your caregiver if you should take aspirin to prevent heart disease.   Diabetes screening involves taking a blood sample to check your fasting blood sugar level. This should be done once every 3 years, after age 62, if you are within normal weight and without risk factors for diabetes. Testing should be considered at a younger age or be carried out more frequently if you are overweight and have at least 1 risk factor for diabetes.   Colorectal cancer can be detected and often prevented. Most routine colorectal cancer screening begins at the age of 59 and continues  through age 64. However, your caregiver may recommend screening at an earlier age if you have risk factors for colon cancer. On a yearly basis, your caregiver may provide home test kits to check for hidden blood in the stool. Use of a small camera at the end of a tube, to directly examine the colon (sigmoidoscopy or colonoscopy), can detect the earliest forms of colorectal cancer. Talk to your caregiver about this at age 14, when routine screening begins. Direct examination of the colon should be repeated every 5 to 10 years through age 17, unless early forms of pre-cancerous polyps or small growths are found.   Hepatitis C blood testing is recommended for all people born from [birth year] through 1965 and any individual with known risks for hepatitis C.   Healthy men should no longer receive prostate-specific antigen (PSA) blood tests as part of routine cancer screening. Consult with your caregiver about prostate cancer screening.   Testicular cancer screening is not recommended for adolescents or adult males who have no  symptoms. Screening includes self-exam, caregiver exam, and other screening tests. Consult with your caregiver about any symptoms you have or any concerns you have about testicular cancer.   Practice safe sex. Use condoms and avoid high-risk sexual practices to reduce the spread of sexually transmitted infections (STIs).   Use sunscreen with a sun protection factor (SPF) of 30 or greater. Apply sunscreen liberally and repeatedly throughout the day. You should seek shade when your shadow is shorter than you. Protect yourself by wearing long sleeves, pants, a wide-brimmed hat, and sunglasses year round, whenever you are outdoors.   Notify your caregiver of new moles or changes in moles, especially if there is a change in shape or color. Also notify your caregiver if a mole is larger than the size of a pencil eraser.   A one-time screening for abdominal aortic aneurysm (AAA) and surgical repair of large AAAs by sound wave imaging (ultrasonography) is recommended for ages 60 to 77 years who are current or former smokers.   Stay current with your immunizations.  Document Released: 06/26/2007 Document Revised: 12/17/2010 Document Reviewed: 05/25/2010 [facility] Patient Information 2012 [place].

## 2011-08-18 NOTE — Assessment & Plan Note (Signed)
I will check his labs to look for myopathy, inflammation, infection, anemia, thyroid disease, etc.

## 2011-08-18 NOTE — Assessment & Plan Note (Signed)
I will check his labs today to look for secondary causes of fatigue

## 2011-11-02 ENCOUNTER — Telehealth: Payer: Self-pay | Admitting: Internal Medicine

## 2011-11-02 NOTE — Telephone Encounter (Signed)
Caller: [name]/Patient; Patient Name: Daniel Allen; PCP: Scarlette Calico (Adults only); Best Callback Phone Number: [phone] calling about starting with swollen area under L eye and eye is watery when first wakes up in the morning -onset 10/31/11. Swelling goes down during the day but eye remains watery. No itching or irritation of eye.  Today he noticed he has 1-2 bumps below eye and he has some little pink bumps on chin and neck that are slightly itchy. He has not tried any eye drops or other OTC creams or meds. No change in vision. Triage and Care advice per EFE:[facility] Problems and Rash Protocols and advised to be checked within 4 hours for "Rash involves eye, eye lid, or tip of nose". He does not want to go to [facility] tonght. Checked with office staff and scheduled appointment at [place] on 11/03/11.

## 2011-11-03 ENCOUNTER — Encounter: Payer: Self-pay | Admitting: Internal Medicine

## 2011-11-03 ENCOUNTER — Ambulatory Visit (INDEPENDENT_AMBULATORY_CARE_PROVIDER_SITE_OTHER): Payer: BC Managed Care – PPO | Admitting: Internal Medicine

## 2011-11-03 VITALS — BP 112/72 | HR 68 | Temp 97.6°F | Ht 73.0 in | Wt 190.5 lb

## 2011-11-03 DIAGNOSIS — R21 Rash and other nonspecific skin eruption: Secondary | ICD-10-CM | POA: Insufficient documentation

## 2011-11-03 DIAGNOSIS — L739 Follicular disorder, unspecified: Secondary | ICD-10-CM | POA: Insufficient documentation

## 2011-11-03 DIAGNOSIS — L738 Other specified follicular disorders: Secondary | ICD-10-CM

## 2011-11-03 MED ORDER — AZITHROMYCIN 250 MG PO TABS: ORAL_TABLET | ORAL | Status: DC

## 2011-11-03 MED ORDER — TRIAMCINOLONE ACETONIDE 0.1 % EX CREA: TOPICAL_CREAM | Freq: Two times a day (BID) | CUTANEOUS | Status: DC

## 2011-11-03 NOTE — Patient Instructions (Addendum)
Take all new medications as prescribed Continue all other medications as before  

## 2011-11-07 ENCOUNTER — Encounter: Payer: Self-pay | Admitting: Internal Medicine

## 2011-11-07 NOTE — Progress Notes (Signed)
  Subjective:    Patient ID: Daniel Allen, male    DOB: 1960-05-13, 51 y.o.   MRN: [id number]  HPI  Here with 4 days itchy rash without fever, pain to area below the left eye, no hx of same, no obvious contact allergen.  Better with OTC cortisone but still persists.  Also with several tender white bumps incidentally to the left submental area as well, without fever, drainage, no swelling or red streaks.  No neck or sinus symptoms,  Pt denies chest pain, increased sob or doe, wheezing, orthopnea, PND, increased LE swelling, palpitations, dizziness or syncope.  Pt denies new neurological symptoms such as new headache, or facial or extremity weakness or numbness   Pt denies polydipsia, polyuria. Past Medical History  Diagnosis Date  . Hx of colonic polyps   . Regional enteritis of unspecified site     Crohn's Colitis   Past Surgical History  Procedure Date  . Colonoscopy     reports that he has never smoked. He has never used smokeless tobacco. He reports that he does not drink alcohol or use illicit drugs. family history includes Cancer in his father and Heart disease in his father.  There is no history of Colon cancer, and Esophageal cancer, and Stomach cancer, and Alcohol abuse, and Diabetes, and Early death, and Hyperlipidemia, and Hypertension, and Stroke, . No Known Allergies Current Outpatient Prescriptions on File Prior to Visit  Medication Sig Dispense Refill  . ASACOL 400 MG EC tablet TAKE 1 TABLET BY MOUTH EVERY DAY  30 tablet  0  . ASACOL 400 MG EC tablet TAKE 1 TABLET BY MOUTH EVERY DAY  30 tablet  0   Review of Systems  Constitutional: Negative for diaphoresis and unexpected weight change.  HENT: Negative for tinnitus.   Eyes: Negative for photophobia and visual disturbance.  Respiratory: Negative for choking and stridor.   Gastrointestinal: Negative for vomiting and blood in stool.  Genitourinary: Negative for hematuria and decreased urine volume.  Musculoskeletal: Negative  for gait problem.  Skin: Negative for color change and wound.  Neurological: Negative for tremors and numbness.  Psychiatric/Behavioral: Negative for decreased concentration. The patient is not hyperactive.       Objective:   Physical Exam BP 112/72  Pulse 68  Temp 97.6 F (36.4 C) (Oral)  Ht 6' 1"  (1.854 m)  Wt 190 lb 8 oz (86.41 kg)  BMI 25.13 kg/m2  SpO2 97% Physical Exam  VS noted, not overly ill appearing Constitutional: Pt appears well-developed and well-nourished.  HENT: Head: Normocephalic.  Right Ear: External ear normal.  Left Ear: External ear normal.  Eyes: Conjunctivae and EOM are normal. Pupils are equal, round, and reactive to light. 2 cm area below left eye with nontender nonraised pruritic area  Neck: Normal range of motion. Neck supple.  has Submental 2 areas pustular lesion, raised, tender without red streaks or surrounding LA Cardiovascular: Normal rate and regular rhythm.   Pulmonary/Chest: Effort normal and breath sounds normal.  Neurological: Pt is alert. Not confused  Skin: Skin is warm. No erythema.  Psychiatric: Pt behavior is normal. Thought content normal.     Assessment & Plan:

## 2011-11-07 NOTE — Assessment & Plan Note (Signed)
I suspect a contact dermatitis area but allergen not clear, ok for steroid cr prn, to f/u any worsening symptoms or concerns

## 2011-11-07 NOTE — Assessment & Plan Note (Signed)
Mild to mod, for antibx course,  to f/u any worsening symptoms or concerns 

## 2011-12-01 ENCOUNTER — Encounter: Payer: Self-pay | Admitting: Gastroenterology

## 2012-02-26 ENCOUNTER — Other Ambulatory Visit: Payer: Self-pay

## 2012-05-03 ENCOUNTER — Encounter: Payer: Self-pay | Admitting: Gastroenterology

## 2012-05-19 ENCOUNTER — Ambulatory Visit (INDEPENDENT_AMBULATORY_CARE_PROVIDER_SITE_OTHER): Payer: BC Managed Care – PPO | Admitting: Internal Medicine

## 2012-05-19 ENCOUNTER — Encounter: Payer: Self-pay | Admitting: Internal Medicine

## 2012-05-19 VITALS — BP 108/80 | HR 73 | Temp 97.3°F | Resp 16 | Ht 72.0 in | Wt 189.1 lb

## 2012-05-19 DIAGNOSIS — M549 Dorsalgia, unspecified: Secondary | ICD-10-CM

## 2012-05-19 DIAGNOSIS — M5441 Lumbago with sciatica, right side: Secondary | ICD-10-CM | POA: Insufficient documentation

## 2012-05-19 NOTE — Assessment & Plan Note (Signed)
He has a hx of Crohn's so I will check a plain film to see if he has AS I think he may have herniated a disc but his ss/s have mostly resolved and he does not want anything for pain at this time  He was given pt ed material about back pain and he will let me know if he has any new or worsening symptoms

## 2012-05-19 NOTE — Progress Notes (Signed)
Subjective:    Patient ID: Daniel Allen, male    DOB: 23-Jul-1960, 52 y.o.   MRN: [id number]  Back Pain This is a new problem. The current episode started in the past 7 days. The problem occurs intermittently. The problem has been gradually improving since onset. The pain is present in the lumbar spine. The quality of the pain is described as aching. The pain radiates to the left knee. The pain is at a severity of 2/10. The pain is mild. The pain is worse during the day. The symptoms are aggravated by bending. Associated symptoms include leg pain, numbness, paresthesias, tingling and weakness. Pertinent negatives include no abdominal pain, bladder incontinence, bowel incontinence, chest pain, dysuria, fever, headaches, paresis, pelvic pain, perianal numbness or weight loss. (Earlier in the week he had some N/W/T in his left leg but that has resolved) He has tried nothing for the symptoms. The treatment provided significant relief.      Review of Systems  Constitutional: Negative for fever, chills, weight loss, diaphoresis, activity change, appetite change, fatigue and unexpected weight change.  HENT: Negative.   Eyes: Negative.   Respiratory: Negative.  Negative for cough, chest tightness, shortness of breath, wheezing and stridor.   Cardiovascular: Negative.  Negative for chest pain, palpitations and leg swelling.  Gastrointestinal: Negative.  Negative for nausea, vomiting, abdominal pain, diarrhea, constipation and bowel incontinence.  Endocrine: Negative.   Genitourinary: Negative.  Negative for bladder incontinence, dysuria, frequency, hematuria, flank pain, difficulty urinating and pelvic pain.  Musculoskeletal: Positive for back pain. Negative for myalgias, joint swelling, arthralgias and gait problem.  Skin: Negative for color change, pallor, rash and wound.  Allergic/Immunologic: Negative.   Neurological: Positive for tingling, weakness, numbness and paresthesias. Negative for  dizziness, tremors and headaches.  Hematological: Negative for adenopathy. Does not bruise/bleed easily.  Psychiatric/Behavioral: Negative.        Objective:   Physical Exam  Vitals reviewed. Constitutional: He appears well-developed and well-nourished. No distress.  HENT:  Head: Normocephalic and atraumatic.  Mouth/Throat: Oropharynx is clear and moist. No oropharyngeal exudate.  Eyes: Conjunctivae are normal. Right eye exhibits no discharge. Left eye exhibits no discharge. No scleral icterus.  Neck: Normal range of motion. Neck supple. No JVD present. No tracheal deviation present. No thyromegaly present.  Cardiovascular: Normal rate, regular rhythm, normal heart sounds and intact distal pulses.  Exam reveals no gallop and no friction rub.   No murmur heard. Pulses:      Carotid pulses are 1+ on the right side, and 1+ on the left side.      Radial pulses are 1+ on the right side, and 1+ on the left side.       Femoral pulses are 1+ on the right side, and 1+ on the left side.      Popliteal pulses are 1+ on the right side, and 1+ on the left side.       Dorsalis pedis pulses are 1+ on the right side, and 1+ on the left side.       Posterior tibial pulses are 1+ on the right side, and 1+ on the left side.  Pulmonary/Chest: Effort normal and breath sounds normal. No stridor. No respiratory distress. He has no wheezes. He has no rales. He exhibits no tenderness.  Abdominal: Soft. Bowel sounds are normal. He exhibits no distension and no mass. There is no tenderness. There is no rebound and no guarding.  Musculoskeletal: Normal range of motion. He exhibits no edema and  no tenderness.       Lumbar back: He exhibits tenderness and bony tenderness. He exhibits normal range of motion, no swelling, no edema, no deformity, no laceration, no pain, no spasm and normal pulse.       Back:  Lymphadenopathy:    He has no cervical adenopathy.  Neurological: He is alert. He has normal strength. He  displays no atrophy, no tremor and normal reflexes. No cranial nerve deficit or sensory deficit. He exhibits normal muscle tone. He displays a negative Romberg sign. He displays no seizure activity. Coordination and gait normal. He displays no Babinski's sign on the right side. He displays no Babinski's sign on the left side.  Reflex Scores:      Tricep reflexes are 1+ on the right side and 1+ on the left side.      Bicep reflexes are 1+ on the right side and 1+ on the left side.      Brachioradialis reflexes are 1+ on the right side and 1+ on the left side.      Patellar reflexes are 1+ on the right side and 1+ on the left side.      Achilles reflexes are 1+ on the right side and 1+ on the left side. Neg SLR in BLE  Skin: Skin is warm and dry. No rash noted. He is not diaphoretic. No erythema. No pallor.  Psychiatric: He has a normal mood and affect. His behavior is normal. Judgment and thought content normal.     Lab Results  Component Value Date   WBC 6.6 08/18/2011   HGB 15.8 08/18/2011   HCT 46.9 08/18/2011   PLT 200.0 08/18/2011   GLUCOSE 99 08/18/2011   CHOL 213* 08/18/2011   TRIG 121.0 08/18/2011   HDL 44.80 08/18/2011   LDLDIRECT 142.3 08/18/2011   ALT 20 08/18/2011   AST 23 08/18/2011   NA 140 08/18/2011   K 4.4 08/18/2011   CL 103 08/18/2011   CREATININE 0.9 08/18/2011   BUN 14 08/18/2011   CO2 30 08/18/2011   TSH 1.92 08/18/2011   PSA 0.73 08/18/2011       Assessment & Plan:

## 2012-05-19 NOTE — Patient Instructions (Signed)
Back Pain, Adult Low back pain is very common. About 1 in 5 people have back pain.The cause of low back pain is rarely dangerous. The pain often gets better over time.About half of people with a sudden onset of back pain feel better in just 2 weeks. About 8 in 10 people feel better by 6 weeks.  CAUSES Some common causes of back pain include:  Strain of the muscles or ligaments supporting the spine.  Wear and tear (degeneration) of the spinal discs.  Arthritis.  Direct injury to the back. DIAGNOSIS Most of the time, the direct cause of low back pain is not known.However, back pain can be treated effectively even when the exact cause of the pain is unknown.Answering your caregiver's questions about your overall health and symptoms is one of the most accurate ways to make sure the cause of your pain is not dangerous. If your caregiver needs more information, he or she may order lab work or imaging tests (X-rays or MRIs).However, even if imaging tests show changes in your back, this usually does not require surgery. HOME CARE INSTRUCTIONS For many people, back pain returns.Since low back pain is rarely dangerous, it is often a condition that people can learn to [facility] their own.   Remain active. It is stressful on the back to sit or stand in one place. Do not sit, drive, or stand in one place for more than 30 minutes at a time. Take short walks on level surfaces as soon as pain allows.Try to increase the length of time you walk each day.  Do not stay in bed.Resting more than 1 or 2 days can delay your recovery.  Do not avoid exercise or work.Your body is made to move.It is not dangerous to be active, even though your back may hurt.Your back will likely heal faster if you return to being active before your pain is gone.  Pay attention to your body when you bend and lift. Many people have less discomfortwhen lifting if they bend their knees, keep the load close to their bodies,and  avoid twisting. Often, the most comfortable positions are those that put less stress on your recovering back.  Find a comfortable position to sleep. Use a firm mattress and lie on your side with your knees slightly bent. If you lie on your back, put a pillow under your knees.  Only take over-the-counter or prescription medicines as directed by your caregiver. Over-the-counter medicines to reduce pain and inflammation are often the most helpful.Your caregiver may prescribe muscle relaxant drugs.These medicines help dull your pain so you can more quickly return to your normal activities and healthy exercise.  Put ice on the injured area.  Put ice in a plastic bag.  Place a towel between your skin and the bag.  Leave the ice on for 15 to 20 minutes, 3 to 4 times a day for the first 2 to 3 days. After that, ice and heat may be alternated to reduce pain and spasms.  Ask your caregiver about trying back exercises and gentle massage. This may be of some benefit.  Avoid feeling anxious or stressed.Stress increases muscle tension and can worsen back pain.It is important to recognize when you are anxious or stressed and learn ways to manage it.Exercise is a great option. SEEK MEDICAL CARE IF:  You have pain that is not relieved with rest or medicine.  You have pain that does not improve in 1 week.  You have new symptoms.  You are generally  not feeling well. SEEK IMMEDIATE MEDICAL CARE IF:   You have pain that radiates from your back into your legs.  You develop new bowel or bladder control problems.  You have unusual weakness or numbness in your arms or legs.  You develop nausea or vomiting.  You develop abdominal pain.  You feel faint. Document Released: 12/28/2004 Document Revised: 06/29/2011 Document Reviewed: 05/18/2010 [facility] Patient Information 2013 [employer].

## 2012-06-01 ENCOUNTER — Ambulatory Visit (INDEPENDENT_AMBULATORY_CARE_PROVIDER_SITE_OTHER): Payer: BC Managed Care – PPO | Admitting: Internal Medicine

## 2012-06-01 ENCOUNTER — Other Ambulatory Visit (INDEPENDENT_AMBULATORY_CARE_PROVIDER_SITE_OTHER): Payer: BC Managed Care – PPO

## 2012-06-01 ENCOUNTER — Encounter: Payer: Self-pay | Admitting: Internal Medicine

## 2012-06-01 VITALS — BP 102/64 | HR 68 | Temp 97.1°F | Resp 16 | Ht 72.0 in | Wt 188.0 lb

## 2012-06-01 DIAGNOSIS — Z0000 Encounter for general adult medical examination without abnormal findings: Secondary | ICD-10-CM

## 2012-06-01 LAB — COMPREHENSIVE METABOLIC PANEL
ALT: 21 U/L (ref 0–53)
Albumin: 4.5 g/dL (ref 3.5–5.2)
CO2: 29 mEq/L (ref 19–32)
Calcium: 9.4 mg/dL (ref 8.4–10.5)
Chloride: 103 mEq/L (ref 96–112)
GFR: 83.45 mL/min (ref >60.00)
Sodium: 139 mEq/L (ref 135–145)
Total Protein: 7.1 g/dL (ref 6.0–8.3)

## 2012-06-01 LAB — LDL CHOLESTEROL, DIRECT: Direct LDL: 137.8 mg/dL

## 2012-06-01 LAB — CBC WITH DIFFERENTIAL/PLATELET
Basophils Absolute: 0.1 10*3/uL (ref 0.0–0.1)
Lymphocytes Relative: 17.6 % (ref 12.0–46.0)
Monocytes Relative: 5.4 % (ref 3.0–12.0)
Platelets: 228 10*3/uL (ref 150.0–400.0)
RDW: 13.4 % (ref 11.5–14.6)

## 2012-06-01 LAB — LIPID PANEL: Total CHOL/HDL Ratio: 5

## 2012-06-01 LAB — URINALYSIS, ROUTINE W REFLEX MICROSCOPIC
Bilirubin Urine: NEGATIVE
Ketones, ur: NEGATIVE
Leukocytes, UA: NEGATIVE
pH: 6 (ref 5.0–8.0)

## 2012-06-01 LAB — PSA: PSA: 0.77 ng/mL (ref 0.10–4.00)

## 2012-06-01 LAB — TSH: TSH: 1.95 u[IU]/mL (ref 0.35–5.50)

## 2012-06-01 NOTE — Progress Notes (Signed)
Subjective:    Patient ID: Daniel Allen, male    DOB: 26-Jul-1960, 52 y.o.   MRN: [id number]  HPI  He returns for a physical, he tells me that he feels well and he offers no complaints today.  Review of Systems  Constitutional: Negative.  Negative for diaphoresis and fatigue.  HENT: Negative.   Eyes: Negative.   Respiratory: Negative.  Negative for cough, chest tightness and shortness of breath.   Cardiovascular: Negative.  Negative for chest pain, palpitations and leg swelling.  Gastrointestinal: Negative.   Endocrine: Negative.   Genitourinary: Negative.   Musculoskeletal: Negative.   Skin: Negative.   Allergic/Immunologic: Negative.   Neurological: Negative.   Hematological: Negative.   Psychiatric/Behavioral: Negative.        Objective:   Physical Exam  Vitals reviewed. Constitutional: He is oriented to person, place, and time. He appears well-developed and well-nourished. No distress.  HENT:  Head: Normocephalic and atraumatic.  Mouth/Throat: Oropharynx is clear and moist. No oropharyngeal exudate.  Eyes: Conjunctivae are normal. Right eye exhibits no discharge. Left eye exhibits no discharge. No scleral icterus.  Neck: Normal range of motion. Neck supple. No JVD present. No tracheal deviation present. No thyromegaly present.  Cardiovascular: Normal rate, regular rhythm, S1 normal, S2 normal, normal heart sounds and intact distal pulses.   Occasional extrasystoles are present. Exam reveals no gallop and no friction rub.   No murmur heard. Pulses:      Carotid pulses are 1+ on the right side, and 1+ on the left side.      Radial pulses are 1+ on the right side, and 1+ on the left side.       Femoral pulses are 1+ on the right side, and 1+ on the left side.      Popliteal pulses are 1+ on the right side, and 1+ on the left side.       Dorsalis pedis pulses are 1+ on the right side, and 1+ on the left side.       Posterior tibial pulses are 1+ on the right side, and 1+  on the left side.  Pulmonary/Chest: Effort normal and breath sounds normal. No stridor. No respiratory distress. He has no wheezes. He has no rales. He exhibits no tenderness.  Abdominal: Soft. Bowel sounds are normal. He exhibits no distension and no mass. There is no tenderness. There is no rebound and no guarding. Hernia confirmed negative in the right inguinal area and confirmed negative in the left inguinal area.  Genitourinary: Rectum normal, prostate normal, testes normal and penis normal. Rectal exam shows no external hemorrhoid, no internal hemorrhoid, no fissure, no mass, no tenderness and anal tone normal. Guaiac negative stool. Prostate is not enlarged and not tender. Right testis shows no mass, no swelling and no tenderness. Right testis is descended. Left testis shows no mass, no swelling and no tenderness. Left testis is descended. Circumcised. No penile erythema or penile tenderness. No discharge found.  Musculoskeletal: Normal range of motion. He exhibits no edema and no tenderness.  Lymphadenopathy:    He has no cervical adenopathy.       Right: No inguinal adenopathy present.       Left: No inguinal adenopathy present.  Neurological: He is oriented to person, place, and time.  Skin: Skin is warm and dry. No rash noted. He is not diaphoretic. No erythema. No pallor.  Psychiatric: He has a normal mood and affect. His behavior is normal. Judgment and thought content normal.  Lab Results  Component Value Date   WBC 6.6 08/18/2011   HGB 15.8 08/18/2011   HCT 46.9 08/18/2011   PLT 200.0 08/18/2011   GLUCOSE 99 08/18/2011   CHOL 213* 08/18/2011   TRIG 121.0 08/18/2011   HDL 44.80 08/18/2011   LDLDIRECT 142.3 08/18/2011   ALT 20 08/18/2011   AST 23 08/18/2011   NA 140 08/18/2011   K 4.4 08/18/2011   CL 103 08/18/2011   CREATININE 0.9 08/18/2011   BUN 14 08/18/2011   CO2 30 08/18/2011   TSH 1.92 08/18/2011   PSA 0.73 08/18/2011       Assessment & Plan:

## 2012-06-01 NOTE — Patient Instructions (Signed)
Health Maintenance, Males A healthy lifestyle and preventative care can promote health and wellness.  Maintain regular health, dental, and eye exams.  Eat a healthy diet. Foods like vegetables, fruits, whole grains, low-fat dairy products, and lean protein foods contain the nutrients you need without too many calories. Decrease your intake of foods high in solid fats, added sugars, and salt. Get information about a proper diet from your caregiver, if necessary.  Regular physical exercise is one of the most important things you can do for your health. Most adults should get at least 150 minutes of moderate-intensity exercise (any activity that increases your heart rate and causes you to sweat) each week. In addition, most adults need muscle-strengthening exercises on 2 or more days a week.   Maintain a healthy weight. The body mass index (BMI) is a screening tool to identify possible weight problems. It provides an estimate of body fat based on height and weight. Your caregiver can help determine your BMI, and can help you achieve or maintain a healthy weight. For adults 20 years and older:  A BMI below 18.5 is considered underweight.  A BMI of 18.5 to 24.9 is normal.  A BMI of 25 to 29.9 is considered overweight.  A BMI of 30 and above is considered obese.  Maintain normal blood lipids and cholesterol by exercising and minimizing your intake of saturated fat. Eat a balanced diet with plenty of fruits and vegetables. Blood tests for lipids and cholesterol should begin at age 20 and be repeated every 5 years. If your lipid or cholesterol levels are high, you are over 50, or you are a high risk for heart disease, you may need your cholesterol levels checked more frequently.Ongoing high lipid and cholesterol levels should be treated with medicines, if diet and exercise are not effective.  If you smoke, find out from your caregiver how to quit. If you do not use tobacco, do not start.  If you  choose to drink alcohol, do not exceed 2 drinks per day. One drink is considered to be 12 ounces (355 mL) of beer, 5 ounces (148 mL) of wine, or 1.5 ounces (44 mL) of liquor.  Avoid use of street drugs. Do not share needles with anyone. Ask for help if you need support or instructions about stopping the use of drugs.  High blood pressure causes heart disease and increases the risk of stroke. Blood pressure should be checked at least every 1 to 2 years. Ongoing high blood pressure should be treated with medicines if weight loss and exercise are not effective.  If you are 45 to 52 years old, ask your caregiver if you should take aspirin to prevent heart disease.  Diabetes screening involves taking a blood sample to check your fasting blood sugar level. This should be done once every 3 years, after age 45, if you are within normal weight and without risk factors for diabetes. Testing should be considered at a younger age or be carried out more frequently if you are overweight and have at least 1 risk factor for diabetes.  Colorectal cancer can be detected and often prevented. Most routine colorectal cancer screening begins at the age of 50 and continues through age 75. However, your caregiver may recommend screening at an earlier age if you have risk factors for colon cancer. On a yearly basis, your caregiver may provide home test kits to check for hidden blood in the stool. Use of a small camera at the end of a tube,   to directly examine the colon (sigmoidoscopy or colonoscopy), can detect the earliest forms of colorectal cancer. Talk to your caregiver about this at age 50, when routine screening begins. Direct examination of the colon should be repeated every 5 to 10 years through age 75, unless early forms of pre-cancerous polyps or small growths are found.  Hepatitis C blood testing is recommended for all people born from 1945 through 1965 and any individual with known risks for hepatitis C.  Healthy  men should no longer receive prostate-specific antigen (PSA) blood tests as part of routine cancer screening. Consult with your caregiver about prostate cancer screening.  Testicular cancer screening is not recommended for adolescents or adult males who have no symptoms. Screening includes self-exam, caregiver exam, and other screening tests. Consult with your caregiver about any symptoms you have or any concerns you have about testicular cancer.  Practice safe sex. Use condoms and avoid high-risk sexual practices to reduce the spread of sexually transmitted infections (STIs).  Use sunscreen with a sun protection factor (SPF) of 30 or greater. Apply sunscreen liberally and repeatedly throughout the day. You should seek shade when your shadow is shorter than you. Protect yourself by wearing long sleeves, pants, a wide-brimmed hat, and sunglasses year round, whenever you are outdoors.  Notify your caregiver of new moles or changes in moles, especially if there is a change in shape or color. Also notify your caregiver if a mole is larger than the size of a pencil eraser.  A one-time screening for abdominal aortic aneurysm (AAA) and surgical repair of large AAAs by sound wave imaging (ultrasonography) is recommended for ages 65 to 75 years who are current or former smokers.  Stay current with your immunizations. Document Released: 06/26/2007 Document Revised: 03/22/2011 Document Reviewed: 05/25/2010 [employer] Patient Information 2014 [employer].  

## 2012-06-01 NOTE — Assessment & Plan Note (Signed)
Exam done Vaccines were reviewed Labs ordered Pt ed material was given 

## 2012-09-14 ENCOUNTER — Encounter: Payer: Self-pay | Admitting: Gastroenterology

## 2012-09-18 ENCOUNTER — Ambulatory Visit (INDEPENDENT_AMBULATORY_CARE_PROVIDER_SITE_OTHER): Payer: BC Managed Care – PPO | Admitting: Internal Medicine

## 2012-09-18 ENCOUNTER — Encounter: Payer: Self-pay | Admitting: Internal Medicine

## 2012-09-18 VITALS — BP 108/68 | HR 67 | Temp 98.1°F | Resp 16 | Wt 189.0 lb

## 2012-09-18 DIAGNOSIS — K4090 Unilateral inguinal hernia, without obstruction or gangrene, not specified as recurrent: Secondary | ICD-10-CM

## 2012-09-18 NOTE — Assessment & Plan Note (Signed)
General surgery referral

## 2012-09-18 NOTE — Patient Instructions (Signed)

## 2012-09-18 NOTE — Progress Notes (Signed)
  Subjective:    Patient ID: Daniel Allen, male    DOB: 1960-06-20, 52 y.o.   MRN: [id number]  HPI  He complains of an intermittent dull pain and a bulge in his right lower abd/groin for 3 months.  Review of Systems  Constitutional: Negative.  Negative for fever, chills, diaphoresis, appetite change and fatigue.  HENT: Negative.   Eyes: Negative.   Respiratory: Negative.  Negative for cough, chest tightness and shortness of breath.   Cardiovascular: Negative.  Negative for chest pain, palpitations and leg swelling.  Gastrointestinal: Positive for abdominal pain. Negative for nausea, vomiting, diarrhea, constipation, abdominal distention, anal bleeding and rectal pain.  Endocrine: Negative.   Genitourinary: Negative.  Negative for urgency, frequency, hematuria, flank pain, decreased urine volume, penile swelling, scrotal swelling, enuresis, difficulty urinating and testicular pain.  Musculoskeletal: Negative.   Skin: Negative.   Allergic/Immunologic: Negative.   Neurological: Negative.   Hematological: Negative.  Negative for adenopathy. Does not bruise/bleed easily.  Psychiatric/Behavioral: Negative.        Objective:   Physical Exam  Vitals reviewed. Constitutional: He appears well-developed and well-nourished.  Non-toxic appearance. He does not have a sickly appearance. He does not appear ill. No distress.  HENT:  Head: Normocephalic and atraumatic.  Mouth/Throat: Oropharynx is clear and moist. No oropharyngeal exudate.  Eyes: Conjunctivae are normal. Right eye exhibits no discharge. Left eye exhibits no discharge. No scleral icterus.  Neck: Normal range of motion. Neck supple. No JVD present. No tracheal deviation present. No thyromegaly present.  Cardiovascular: Normal rate, regular rhythm, normal heart sounds and intact distal pulses.  Exam reveals no gallop and no friction rub.   No murmur heard. Pulmonary/Chest: Effort normal and breath sounds normal. No stridor. No  respiratory distress. He has no wheezes. He has no rales. He exhibits no tenderness.  Abdominal: Soft. Bowel sounds are normal. He exhibits no distension and no mass. There is no tenderness. There is no rebound and no guarding. A hernia is present. Hernia confirmed positive in the right inguinal area. Hernia confirmed negative in the left inguinal area.  Genitourinary: Penis normal. Right testis shows no mass, no swelling and no tenderness. Right testis is descended. Left testis shows no mass, no swelling and no tenderness. Left testis is descended. Circumcised. No penile erythema or penile tenderness. No discharge found.  Right indirect inguinal hernia is felt only with valsalva and then is spontaneously resolves.  Musculoskeletal: Normal range of motion. He exhibits no edema and no tenderness.  Lymphadenopathy:    He has no cervical adenopathy.  Skin: Skin is warm and dry. No rash noted. He is not diaphoretic. No erythema. No pallor.     Lab Results  Component Value Date   WBC 8.2 06/01/2012   HGB 15.0 06/01/2012   HCT 44.9 06/01/2012   PLT 228.0 06/01/2012   GLUCOSE 90 06/01/2012   CHOL 213* 06/01/2012   TRIG 136.0 06/01/2012   HDL 43.40 06/01/2012   LDLDIRECT 137.8 06/01/2012   ALT 21 06/01/2012   AST 21 06/01/2012   NA 139 06/01/2012   K 4.5 06/01/2012   CL 103 06/01/2012   CREATININE 1.0 06/01/2012   BUN 14 06/01/2012   CO2 29 06/01/2012   TSH 1.95 06/01/2012   PSA 0.77 06/01/2012       Assessment & Plan:

## 2012-09-22 ENCOUNTER — Encounter: Payer: Self-pay | Admitting: Gastroenterology

## 2012-09-22 ENCOUNTER — Ambulatory Visit (AMBULATORY_SURGERY_CENTER): Payer: BC Managed Care – PPO | Admitting: *Deleted

## 2012-09-22 VITALS — Ht 73.0 in | Wt 188.6 lb

## 2012-09-22 DIAGNOSIS — K5090 Crohn's disease, unspecified, without complications: Secondary | ICD-10-CM

## 2012-09-22 MED ORDER — NA SULFATE-K SULFATE-MG SULF 17.5-3.13-1.6 GM/177ML PO SOLN: 1.0000 | Freq: Once | ORAL | Status: DC

## 2012-09-25 ENCOUNTER — Ambulatory Visit (INDEPENDENT_AMBULATORY_CARE_PROVIDER_SITE_OTHER): Payer: BC Managed Care – PPO | Admitting: General Surgery

## 2012-09-29 ENCOUNTER — Encounter: Payer: BC Managed Care – PPO | Admitting: Gastroenterology

## 2012-09-29 ENCOUNTER — Ambulatory Visit (AMBULATORY_SURGERY_CENTER): Payer: BC Managed Care – PPO | Admitting: Gastroenterology

## 2012-09-29 ENCOUNTER — Encounter: Payer: Self-pay | Admitting: Gastroenterology

## 2012-09-29 VITALS — BP 118/78 | HR 52 | Temp 97.4°F | Resp 10 | Ht 73.0 in | Wt 187.0 lb

## 2012-09-29 DIAGNOSIS — K5090 Crohn's disease, unspecified, without complications: Secondary | ICD-10-CM

## 2012-09-29 DIAGNOSIS — D126 Benign neoplasm of colon, unspecified: Secondary | ICD-10-CM

## 2012-09-29 MED ORDER — SODIUM CHLORIDE 0.9 % IV SOLN: 500.0000 mL | INTRAVENOUS | Status: DC

## 2012-09-29 NOTE — Patient Instructions (Addendum)
YOU HAD AN ENDOSCOPIC PROCEDURE TODAY AT [facility]: Refer to the procedure report that was given to you for any specific questions about what was found during the examination.  If the procedure report does not answer your questions, please call your gastroenterologist to clarify.  If you requested that your care partner not be given the details of your procedure findings, then the procedure report has been included in a sealed envelope for you to review at your convenience later.  YOU SHOULD EXPECT: Some feelings of bloating in the abdomen. Passage of more gas than usual.  Walking can help get rid of the air that was put into your GI tract during the procedure and reduce the bloating. If you had a lower endoscopy (such as a colonoscopy or flexible sigmoidoscopy) you may notice spotting of blood in your stool or on the toilet paper. If you underwent a bowel prep for your procedure, then you may not have a normal bowel movement for a few days.  DIET: Your first meal following the procedure should be a light meal and then it is ok to progress to your normal diet.  A half-sandwich or bowl of soup is an example of a good first meal.  Heavy or fried foods are harder to digest and may make you feel nauseous or bloated.  Likewise meals heavy in dairy and vegetables can cause extra gas to form and this can also increase the bloating.  Drink plenty of fluids but you should avoid alcoholic beverages for 24 hours.  ACTIVITY: Your care partner should take you home directly after the procedure.  You should plan to take it easy, moving slowly for the rest of the day.  You can resume normal activity the day after the procedure however you should NOT DRIVE or use heavy machinery for 24 hours (because of the sedation medicines used during the test).    SYMPTOMS TO REPORT IMMEDIATELY: A gastroenterologist can be reached at any hour.  During normal business hours, 8:30 AM to 5:00 PM Monday through Friday,  call [phone].  After hours and on weekends, please call the GI answering service at [phone] who will take a message and have the physician on call contact you.   Following lower endoscopy (colonoscopy or flexible sigmoidoscopy):  Excessive amounts of blood in the stool  Significant tenderness or worsening of abdominal pains  Swelling of the abdomen that is new, acute  Fever of 100F or higher  FOLLOW UP: If any biopsies were taken you will be contacted by phone or by letter within the next 1-3 weeks.  Call your gastroenterologist if you have not heard about the biopsies in 3 weeks.  Our staff will call the home number listed on your records the next business day following your procedure to check on you and address any questions or concerns that you may have at that time regarding the information given to you following your procedure. This is a courtesy call and so if there is no answer at the home number and we have not heard from you through the emergency physician on call, we will assume that you have returned to your regular daily activities without incident.  SIGNATURES/CONFIDENTIALITY: You and/or your care partner have signed paperwork which will be entered into your electronic medical record.  These signatures attest to the fact that that the information above on your After Visit Summary has been reviewed and is understood.  Full responsibility of the confidentiality of this  discharge information lies with you and/or your care-partner.  Wait biopsy results.  Repeat colonoscopy will be determined by pathology.  Crohns, diverticulosis-handouts given

## 2012-09-29 NOTE — Progress Notes (Signed)
Called to room to assist during endoscopic procedure.  Patient ID and intended procedure confirmed with present staff. Received instructions for my participation in the procedure from the performing physician.  

## 2012-09-29 NOTE — Progress Notes (Signed)
Patient did not experience any of the following events: a burn prior to discharge; a fall within the facility; wrong site/side/patient/procedure/implant event; or a hospital transfer or hospital admission upon discharge from the facility. (G8907) Patient did not have preoperative order for IV antibiotic SSI prophylaxis. (G8918)  

## 2012-09-29 NOTE — Op Note (Signed)
[facility]  [address] [place], [id number]   COLONOSCOPY PROCEDURE REPORT  PATIENT: Daniel Allen, Daniel Allen  MR#: [id number] BIRTHDATE: 1960-03-10 , 52  yrs. old GENDER: Male ENDOSCOPIST: Inda Castle, MD REFERRED BY: PROCEDURE DATE:  09/29/2012 PROCEDURE:   Colonoscopy with biopsy First Screening Colonoscopy - Avg.  risk and is 50 yrs.  old or older - No.  Prior Negative Screening - Now for repeat screening. N/A  History of Adenoma - Now for follow-up colonoscopy & has been > or = to 3 yrs.  N/A ASA CLASS:   Class II INDICATIONS:15 year h/o Crohn's colitis. MEDICATIONS: MAC sedation, administered by CRNA and propofol (Diprivan) 21m IV  DESCRIPTION OF PROCEDURE:   After the risks benefits and alternatives of the procedure were thoroughly explained, informed consent was obtained.  A digital rectal exam revealed no abnormalities of the rectum.   The LB [id number] endoscope was introduced through the anus and advanced to the terminal ileum which was intubated for a short distance. No adverse events experienced.   The quality of the prep was excellent using Suprep The instrument was then slowly withdrawn as the colon was fully examined.      COLON FINDINGS: Inflammatory changes were limited to just inside the rectal vault where there is edematous and slightly hemorrhagic mucosa.  Multiple biopsies were taken.  The remainder of the colon including the cecum, ascending colon, transverse colon, descending colon, and sigmoid were entirely normal.  Random biopsies were taken to rule out dysplasia.  The terminal ileum appeared normal. Inflammatory changes were limited to just inside the rectal vault where there is edematous and slightly hemorrhagic mucosa.  Multiple biopsies were taken.  The remainder of the colon including the cecum, ascending colon, transverse colon, descending colon, and sigmoid were entirely normal.  Random biopsies were taken to  rule out dysplasia.  The terminal ileum appeared normal.     The time to cecum=  .  Withdrawal time=  .  The scope was withdrawn and the procedure completed. COMPLICATIONS: There were no complications.  ENDOSCOPIC IMPRESSION: 1.  Minimal inflammatory changes confined to the rectal vault - Crohn's colitis  RECOMMENDATIONS: Await biopsy results   eSigned:  RInda Castle MD 09/29/2012 9:21 AM   cc: TJanith Lima MD   PATIENT NAME:  SGiovannie, ScerboMR#: [id number]

## 2012-09-29 NOTE — Progress Notes (Signed)
Stable to RR 

## 2012-10-02 ENCOUNTER — Telehealth: Payer: Self-pay | Admitting: *Deleted

## 2012-10-02 NOTE — Telephone Encounter (Signed)
Left message that we called for f/u

## 2012-10-03 ENCOUNTER — Telehealth (INDEPENDENT_AMBULATORY_CARE_PROVIDER_SITE_OTHER): Payer: Self-pay

## 2012-10-03 ENCOUNTER — Ambulatory Visit (INDEPENDENT_AMBULATORY_CARE_PROVIDER_SITE_OTHER): Payer: BC Managed Care – PPO | Admitting: General Surgery

## 2012-10-03 ENCOUNTER — Encounter (INDEPENDENT_AMBULATORY_CARE_PROVIDER_SITE_OTHER): Payer: Self-pay | Admitting: General Surgery

## 2012-10-03 VITALS — BP 122/82 | HR 66 | Temp 97.1°F | Ht 73.0 in | Wt 188.4 lb

## 2012-10-03 DIAGNOSIS — K4091 Unilateral inguinal hernia, without obstruction or gangrene, recurrent: Secondary | ICD-10-CM

## 2012-10-03 NOTE — Patient Instructions (Signed)
Call if you develop pain or signs of incarceration or strangulation

## 2012-10-03 NOTE — Progress Notes (Signed)
Patient ID: Daniel Allen, male   DOB: 18-Nov-1960, 52 y.o.   MRN: [id number]  Chief Complaint  Patient presents with  . New Evaluation    eval ing hernia    HPI Daniel Allen is a 52 y.o. male.  We are asked to see the patient in consultation by Dr. Ronnald Ramp to evaluate him for a recurrent right inguinal hernia. The patient is a 52 year old white male who has been noticing a bulge off and on in his right groin since June. He has discomfort associated with this one to 2 times a week. He describes the pain is sharp. He denies any nausea or vomiting. He did have a right inguinal hernia repair with mesh done in [place] about 14 years ago.  HPI  Past Medical History  Diagnosis Date  . Hx of colonic polyps   . Regional enteritis of unspecified site     Crohn's Colitis  . Inguinal hernia     right    Past Surgical History  Procedure Laterality Date  . Colonoscopy    . Inguinal hernia repair Right 2000    Family History  Problem Relation Age of Onset  . Cancer Father     leukemia  . Heart disease Father   . Colon cancer Neg Hx   . Esophageal cancer Neg Hx   . Stomach cancer Neg Hx   . Alcohol abuse Neg Hx   . Diabetes Neg Hx   . Early death Neg Hx   . Hyperlipidemia Neg Hx   . Hypertension Neg Hx   . Stroke Neg Hx   . Parkinson's disease Mother     Social History History  Substance Use Topics  . Smoking status: Never Smoker   . Smokeless tobacco: Never Used  . Alcohol Use: 1.8 oz/week    3 Glasses of wine per week     Comment: drinks per week    No Known Allergies  No current outpatient prescriptions on file.   No current facility-administered medications for this visit.    Review of Systems Review of Systems  Constitutional: Negative.   HENT: Negative.   Eyes: Negative.   Respiratory: Negative.   Cardiovascular: Negative.   Gastrointestinal: Negative.   Endocrine: Negative.   Genitourinary: Negative.   Musculoskeletal: Negative.   Skin: Negative.    Allergic/Immunologic: Negative.   Neurological: Negative.   Hematological: Negative.   Psychiatric/Behavioral: Negative.     Blood pressure 122/82, pulse 66, temperature 97.1 F (36.2 C), temperature source Temporal, height 6' 1"  (1.854 m), weight 188 lb 6.4 oz (85.458 kg), SpO2 98.00%.  Physical Exam Physical Exam  Constitutional: He is oriented to person, place, and time. He appears well-developed and well-nourished.  HENT:  Head: Normocephalic and atraumatic.  Eyes: Conjunctivae and EOM are normal. Pupils are equal, round, and reactive to light.  Neck: Normal range of motion. Neck supple.  Cardiovascular: Normal rate, regular rhythm and normal heart sounds.   Pulmonary/Chest: Effort normal and breath sounds normal.  Abdominal: Soft. Bowel sounds are normal.  Genitourinary:  There is some very mild palpable fullness of the right groin compared to the left. I was able to feel a small pop of the hernia reducing once in the right groin. There is no palpable bulge or impulse with straining in the left groin.  Musculoskeletal: Normal range of motion.  Neurological: He is alert and oriented to person, place, and time.  Skin: Skin is warm and dry.  Psychiatric: He has a normal  mood and affect. His behavior is normal.    Data Reviewed As above  Assessment    The patient appears to have a small recurrent right inguinal hernia. Because of the risk of incarceration and strangulation I think he would benefit from having a hernia fixed. I've discussed with him in detail the risks and benefits of the operation to fix the hernia as well as some of the technical aspects including the risk of injury to the vas and testicular artery as well as the risk of chronic pain. He might also be a laparoscopic candidate since he has had a previous open repair. At this point he would like to think about it and he will let me know whether he would like to have this repaired or not. If he chooses a laparoscopic  type repair I will refer him to one of my partners that does that surgery.     Plan    At this point we will wait for him to call us back and let us know what he would like to do. He understands the signs and symptoms of incarceration and strangulation       TOTH III,Marleni Gallardo S 10/03/2012, 12:22 PM

## 2012-10-03 NOTE — Telephone Encounter (Signed)
Faxed medical record request to [facility] in [place] @ [phone]

## 2012-10-09 ENCOUNTER — Encounter: Payer: Self-pay | Admitting: Gastroenterology

## 2012-11-16 ENCOUNTER — Other Ambulatory Visit: Payer: Self-pay

## 2012-11-28 ENCOUNTER — Encounter: Payer: BC Managed Care – PPO | Admitting: Gastroenterology

## 2013-08-30 ENCOUNTER — Encounter: Payer: Self-pay | Admitting: Gastroenterology

## 2013-09-14 ENCOUNTER — Ambulatory Visit (INDEPENDENT_AMBULATORY_CARE_PROVIDER_SITE_OTHER): Payer: BC Managed Care – PPO | Admitting: Internal Medicine

## 2013-09-14 ENCOUNTER — Encounter: Payer: Self-pay | Admitting: Internal Medicine

## 2013-09-14 ENCOUNTER — Ambulatory Visit (INDEPENDENT_AMBULATORY_CARE_PROVIDER_SITE_OTHER): Payer: BC Managed Care – PPO

## 2013-09-14 VITALS — BP 126/86 | HR 64 | Temp 98.4°F | Ht 73.0 in | Wt 194.0 lb

## 2013-09-14 DIAGNOSIS — Z0000 Encounter for general adult medical examination without abnormal findings: Secondary | ICD-10-CM

## 2013-09-14 LAB — CBC WITH DIFFERENTIAL/PLATELET
Basophils Absolute: 0.1 10*3/uL (ref 0.0–0.1)
Basophils Relative: 0.6 % (ref 0.0–3.0)
EOS PCT: 2.4 % (ref 0.0–5.0)
Eosinophils Absolute: 0.2 10*3/uL (ref 0.0–0.7)
HCT: 43.5 % (ref 39.0–52.0)
Hemoglobin: 14.8 g/dL (ref 13.0–17.0)
Lymphocytes Relative: 26.3 % (ref 12.0–46.0)
Lymphs Abs: 2.3 10*3/uL (ref 0.7–4.0)
MCHC: 33.9 g/dL (ref 30.0–36.0)
MCV: 91.7 fl (ref 78.0–100.0)
MONOS PCT: 5.6 % (ref 3.0–12.0)
Monocytes Absolute: 0.5 10*3/uL (ref 0.1–1.0)
NEUTROS PCT: 65.1 % (ref 43.0–77.0)
Neutro Abs: 5.7 10*3/uL (ref 1.4–7.7)
PLATELETS: 233 10*3/uL (ref 150.0–400.0)
RBC: 4.75 Mil/uL (ref 4.22–5.81)
RDW: 13.1 % (ref 11.5–15.5)
WBC: 8.8 10*3/uL (ref 4.0–10.5)

## 2013-09-14 LAB — URINALYSIS, ROUTINE W REFLEX MICROSCOPIC
BILIRUBIN URINE: NEGATIVE
HGB URINE DIPSTICK: NEGATIVE
KETONES UR: NEGATIVE
Leukocytes, UA: NEGATIVE
Nitrite: NEGATIVE
Specific Gravity, Urine: 1.01 (ref 1.000–1.030)
TOTAL PROTEIN, URINE-UPE24: NEGATIVE
URINE GLUCOSE: NEGATIVE
Urobilinogen, UA: 0.2 (ref 0.0–1.0)
pH: 7 (ref 5.0–8.0)

## 2013-09-14 LAB — LIPID PANEL
CHOLESTEROL: 225 mg/dL — AB (ref 0–200)
HDL: 38.6 mg/dL — AB (ref >39.00)
NonHDL: 186.4
TRIGLYCERIDES: 275 mg/dL — AB (ref 0.0–149.0)
Total CHOL/HDL Ratio: 6
VLDL: 55 mg/dL — AB (ref 0.0–40.0)

## 2013-09-14 LAB — BASIC METABOLIC PANEL
BUN: 16 mg/dL (ref 6–23)
CO2: 28 mEq/L (ref 19–32)
Calcium: 9.1 mg/dL (ref 8.4–10.5)
Chloride: 102 mEq/L (ref 96–112)
Creatinine, Ser: 1 mg/dL (ref 0.4–1.5)
GFR: 84 mL/min (ref >60.00)
GLUCOSE: 86 mg/dL (ref 70–99)
Potassium: 3.8 mEq/L (ref 3.5–5.1)
Sodium: 137 mEq/L (ref 135–145)

## 2013-09-14 LAB — TSH: TSH: 1.6 u[IU]/mL (ref 0.35–4.50)

## 2013-09-14 LAB — HEPATIC FUNCTION PANEL
ALBUMIN: 4.5 g/dL (ref 3.5–5.2)
ALT: 17 U/L (ref 0–53)
AST: 19 U/L (ref 0–37)
Alkaline Phosphatase: 56 U/L (ref 39–117)
Bilirubin, Direct: 0.1 mg/dL (ref 0.0–0.3)
Total Bilirubin: 0.9 mg/dL (ref 0.2–1.2)
Total Protein: 7.1 g/dL (ref 6.0–8.3)

## 2013-09-14 LAB — PSA: PSA: 0.75 ng/mL (ref 0.10–4.00)

## 2013-09-14 MED ORDER — ASPIRIN EC 81 MG PO TBEC: 81.0000 mg | DELAYED_RELEASE_TABLET | Freq: Every day | ORAL | Status: DC

## 2013-09-14 NOTE — Progress Notes (Signed)
Subjective:    Patient ID: Daniel Allen, male    DOB: 1960-04-29, 53 y.o.   MRN: [id number]  HPI  Here for wellness and f/u as Dr Ronnald Ramp out of office;  Overall doing ok;  Pt denies CP, worsening SOB, DOE, wheezing, orthopnea, PND, worsening LE edema, palpitations, dizziness or syncope.  Pt denies neurological change such as new headache, facial or extremity weakness.  Pt denies polydipsia, polyuria, or low sugar symptoms. Pt states overall good compliance with treatment and medications, good tolerability, and has been trying to follow lower cholesterol diet.  Pt denies worsening depressive symptoms, suicidal ideation or panic. No fever, night sweats, wt loss, loss of appetite, or other constitutional symptoms.  Pt states good ability with ADL's, has low fall risk, home safety reviewed and adequate, no other significant changes in hearing or vision, and only occasionally active with exercise.  Still has recurrent right RIH, saw gen surgury, but has held off on surgury for the time being.  Declines flu shot today. No other complaints Past Medical History  Diagnosis Date  . Hx of colonic polyps   . Regional enteritis of unspecified site     Crohn's Colitis  . Inguinal hernia     right   Past Surgical History  Procedure Laterality Date  . Colonoscopy    . Inguinal hernia repair Right 2000    reports that he has never smoked. He has never used smokeless tobacco. He reports that he drinks about 1.8 ounces of alcohol per week. He reports that he does not use illicit drugs. family history includes Cancer in his father; Heart disease in his father; Parkinson's disease in his mother. There is no history of Colon cancer, Esophageal cancer, Stomach cancer, Alcohol abuse, Diabetes, Early death, Hyperlipidemia, Hypertension, or Stroke. No Known Allergies No current outpatient prescriptions on file prior to visit.   No current facility-administered medications on file prior to visit.    Review of  Systems Constitutional: Negative for increased diaphoresis, other activity, appetite or other siginficant weight change  HENT: Negative for worsening hearing loss, ear pain, facial swelling, mouth sores and neck stiffness.   Eyes: Negative for other worsening pain, redness or visual disturbance.  Respiratory: Negative for shortness of breath and wheezing.   Cardiovascular: Negative for chest pain and palpitations.  Gastrointestinal: Negative for diarrhea, blood in stool, abdominal distention or other pain Genitourinary: Negative for hematuria, flank pain or change in urine volume.  Musculoskeletal: Negative for myalgias or other joint complaints.  Skin: Negative for color change and wound.  Neurological: Negative for syncope and numbness. other than noted Hematological: Negative for adenopathy. or other swelling Psychiatric/Behavioral: Negative for hallucinations, self-injury, decreased concentration or other worsening agitation.      Objective:   Physical Exam BP 126/86  Pulse 64  Temp(Src) 98.4 F (36.9 C) (Oral)  Ht 6' 1"  (1.854 m)  Wt 194 lb (87.998 kg)  BMI 25.60 kg/m2  SpO2 98% VS noted,  Constitutional: Pt is oriented to person, place, and time. Appears well-developed and well-nourished.  Head: Normocephalic and atraumatic.  Right Ear: External ear normal.  Left Ear: External ear normal.  Nose: Nose normal.  Mouth/Throat: Oropharynx is clear and moist.  Eyes: Conjunctivae and EOM are normal. Pupils are equal, round, and reactive to light.  Neck: Normal range of motion. Neck supple. No JVD present. No tracheal deviation present.  Cardiovascular: Normal rate, regular rhythm, normal heart sounds and intact distal pulses.   Pulmonary/Chest: Effort normal and  breath sounds without rales or wheezing  Abdominal: Soft. Bowel sounds are normal. NT. No HSM  Musculoskeletal: Normal range of motion. Exhibits no edema.  Lymphadenopathy:  Has no cervical adenopathy.  Neurological: Pt  is alert and oriented to person, place, and time. Pt has normal reflexes. No cranial nerve deficit. Motor grossly intact Skin: Skin is warm and dry. No rash noted.  Psychiatric:  Has normal mood and affect. Behavior is normal.     Assessment & Plan:   Wt Readings from Last 3 Encounters:  09/14/13 194 lb (87.998 kg)  10/03/12 188 lb 6.4 oz (85.458 kg)  09/29/12 187 lb (84.823 kg)

## 2013-09-14 NOTE — Progress Notes (Signed)
Pre visit review using our clinic review tool, if applicable. No additional management support is needed unless otherwise documented below in the visit note. 

## 2013-09-14 NOTE — Patient Instructions (Addendum)
Please start Aspirin 81 mg - 1 per day - Coated only  Please continue your efforts at being more active, low cholesterol diet, and weight control.  You are otherwise up to date with prevention measures today.  Please keep your appointments with your specialists as you may have planned  Please go to the LAB in the Basement (turn left off the elevator) for the tests to be done today  You will be contacted by phone if any changes need to be made immediately.  Otherwise, you will receive a letter about your results with an explanation, but please check with MyChart first.  Please return in 1 year for your yearly visit, or sooner if needed

## 2013-09-14 NOTE — Assessment & Plan Note (Signed)

## 2013-09-18 ENCOUNTER — Encounter: Payer: Self-pay | Admitting: Internal Medicine

## 2013-09-18 ENCOUNTER — Other Ambulatory Visit: Payer: Self-pay | Admitting: Internal Medicine

## 2013-09-18 DIAGNOSIS — E785 Hyperlipidemia, unspecified: Secondary | ICD-10-CM | POA: Insufficient documentation

## 2013-09-18 LAB — LDL CHOLESTEROL, DIRECT: Direct LDL: 162.9 mg/dL

## 2013-09-18 MED ORDER — ATORVASTATIN CALCIUM 10 MG PO TABS: 10.0000 mg | ORAL_TABLET | Freq: Every day | ORAL | Status: DC

## 2014-01-25 ENCOUNTER — Encounter: Payer: Self-pay | Admitting: Internal Medicine

## 2014-01-25 ENCOUNTER — Ambulatory Visit (INDEPENDENT_AMBULATORY_CARE_PROVIDER_SITE_OTHER): Payer: BLUE CROSS/BLUE SHIELD | Admitting: Internal Medicine

## 2014-01-25 ENCOUNTER — Other Ambulatory Visit (INDEPENDENT_AMBULATORY_CARE_PROVIDER_SITE_OTHER): Payer: BLUE CROSS/BLUE SHIELD

## 2014-01-25 VITALS — BP 122/84 | HR 85 | Temp 98.5°F | Resp 16 | Ht 73.0 in | Wt 190.0 lb

## 2014-01-25 DIAGNOSIS — R10814 Left lower quadrant abdominal tenderness: Secondary | ICD-10-CM

## 2014-01-25 DIAGNOSIS — K50919 Crohn's disease, unspecified, with unspecified complications: Secondary | ICD-10-CM

## 2014-01-25 DIAGNOSIS — K5732 Diverticulitis of large intestine without perforation or abscess without bleeding: Secondary | ICD-10-CM | POA: Insufficient documentation

## 2014-01-25 LAB — CBC WITH DIFFERENTIAL/PLATELET
BASOS PCT: 0.3 % (ref 0.0–3.0)
Basophils Absolute: 0.1 10*3/uL (ref 0.0–0.1)
EOS ABS: 0.1 10*3/uL (ref 0.0–0.7)
Eosinophils Relative: 0.4 % (ref 0.0–5.0)
HEMATOCRIT: 45.8 % (ref 39.0–52.0)
Hemoglobin: 15.4 g/dL (ref 13.0–17.0)
LYMPHS ABS: 1.7 10*3/uL (ref 0.7–4.0)
Lymphocytes Relative: 10.3 % — ABNORMAL LOW (ref 12.0–46.0)
MCHC: 33.5 g/dL (ref 30.0–36.0)
MCV: 90.1 fl (ref 78.0–100.0)
Monocytes Absolute: 1.2 10*3/uL — ABNORMAL HIGH (ref 0.1–1.0)
Monocytes Relative: 7.2 % (ref 3.0–12.0)
NEUTROS ABS: 13.2 10*3/uL — AB (ref 1.4–7.7)
Neutrophils Relative %: 81.8 % — ABNORMAL HIGH (ref 43.0–77.0)
Platelets: 235 10*3/uL (ref 150.0–400.0)
RBC: 5.08 Mil/uL (ref 4.22–5.81)
RDW: 13.1 % (ref 11.5–15.5)
WBC: 16.1 10*3/uL — AB (ref 4.0–10.5)

## 2014-01-25 LAB — SEDIMENTATION RATE: Sed Rate: 22 mm/hr (ref 0–22)

## 2014-01-25 LAB — COMPREHENSIVE METABOLIC PANEL
ALBUMIN: 4.5 g/dL (ref 3.5–5.2)
ALK PHOS: 68 U/L (ref 39–117)
ALT: 24 U/L (ref 0–53)
AST: 20 U/L (ref 0–37)
BUN: 14 mg/dL (ref 6–23)
CALCIUM: 9.5 mg/dL (ref 8.4–10.5)
CO2: 29 mEq/L (ref 19–32)
Chloride: 102 mEq/L (ref 96–112)
Creatinine, Ser: 0.88 mg/dL (ref 0.40–1.50)
GFR: 96.1 mL/min (ref >60.00)
GLUCOSE: 91 mg/dL (ref 70–99)
Potassium: 4.2 mEq/L (ref 3.5–5.1)
Sodium: 137 mEq/L (ref 135–145)
Total Bilirubin: 1 mg/dL (ref 0.2–1.2)
Total Protein: 7.8 g/dL (ref 6.0–8.3)

## 2014-01-25 LAB — URINALYSIS, ROUTINE W REFLEX MICROSCOPIC
Bilirubin Urine: NEGATIVE
Hgb urine dipstick: NEGATIVE
Ketones, ur: NEGATIVE
Leukocytes, UA: NEGATIVE
Nitrite: NEGATIVE
RBC / HPF: NONE SEEN (ref 0–?)
Specific Gravity, Urine: 1.02 (ref 1.000–1.030)
Total Protein, Urine: NEGATIVE
Urine Glucose: NEGATIVE
Urobilinogen, UA: 0.2 (ref 0.0–1.0)
pH: 6.5 (ref 5.0–8.0)

## 2014-01-25 LAB — AMYLASE: Amylase: 37 U/L (ref 27–131)

## 2014-01-25 LAB — C-REACTIVE PROTEIN: CRP: 10.4 mg/dL (ref 0.5–20.0)

## 2014-01-25 LAB — LIPASE: Lipase: 17 U/L (ref 11.0–59.0)

## 2014-01-25 MED ORDER — CIPROFLOXACIN HCL 500 MG PO TABS
500.0000 mg | ORAL_TABLET | Freq: Two times a day (BID) | ORAL | Status: AC
Start: 2014-01-25 — End: 2014-02-01

## 2014-01-25 MED ORDER — METRONIDAZOLE 250 MG PO TABS
250.0000 mg | ORAL_TABLET | Freq: Three times a day (TID) | ORAL | Status: AC
Start: 2014-01-25 — End: 2014-02-01

## 2014-01-25 NOTE — Assessment & Plan Note (Signed)
NED at this time

## 2014-01-25 NOTE — Progress Notes (Signed)
Pre visit review using our clinic review tool, if applicable. No additional management support is needed unless otherwise documented below in the visit note. 

## 2014-01-25 NOTE — Assessment & Plan Note (Signed)
He has LLQ TTP but no diarrhea or bloody stool and no acute abd findings His WBC count is slightly elevated, the other labs are normal This does not appear to be a flare of crohn's but it is consistent with diverticulitis

## 2014-01-25 NOTE — Progress Notes (Signed)
Subjective:    Patient ID: Daniel Allen, male    DOB: Oct 13, 1960, 54 y.o.   MRN: [id number]  Abdominal Pain This is a new problem. The current episode started yesterday. The onset quality is gradual. The problem occurs intermittently. The problem has been unchanged. The pain is located in the LLQ. The pain is at a severity of 1/10. The pain is mild. The quality of the pain is aching, dull and sharp. The abdominal pain does not radiate. Associated symptoms include a fever. Pertinent negatives include no anorexia, arthralgias, belching, constipation, diarrhea, dysuria, flatus, frequency, headaches, hematochezia, hematuria, melena, myalgias, nausea, vomiting or weight loss. Nothing aggravates the pain. The pain is relieved by nothing. He has tried nothing for the symptoms. The treatment provided no relief. His past medical history is significant for Crohn's disease. There is no history of abdominal surgery, colon cancer, gallstones, GERD, irritable bowel syndrome, pancreatitis, PUD or ulcerative colitis.      Review of Systems  Constitutional: Positive for fever. Negative for chills, weight loss, diaphoresis and fatigue.  HENT: Negative.   Eyes: Negative.   Respiratory: Negative.  Negative for cough, choking, chest tightness, shortness of breath and stridor.   Cardiovascular: Negative.  Negative for chest pain, palpitations and leg swelling.  Gastrointestinal: Positive for abdominal pain. Negative for nausea, vomiting, diarrhea, constipation, blood in stool, melena, hematochezia, abdominal distention, anal bleeding, rectal pain, anorexia and flatus.  Endocrine: Negative.   Genitourinary: Negative.  Negative for dysuria, urgency, frequency, hematuria, flank pain, decreased urine volume and difficulty urinating.  Musculoskeletal: Negative.  Negative for myalgias and arthralgias.  Skin: Negative.  Negative for rash.  Allergic/Immunologic: Negative.   Neurological: Negative.  Negative for  headaches.  Hematological: Negative.   Psychiatric/Behavioral: Negative.        Objective:   Physical Exam  Constitutional: He is oriented to person, place, and time. He appears well-developed and well-nourished.  Non-toxic appearance. He does not have a sickly appearance. He does not appear ill. No distress.  HENT:  Head: Normocephalic and atraumatic.  Mouth/Throat: Oropharynx is clear and moist. No oropharyngeal exudate.  Eyes: Conjunctivae are normal. Right eye exhibits no discharge. Left eye exhibits no discharge. No scleral icterus.  Neck: Normal range of motion. Neck supple. No JVD present. No tracheal deviation present. No thyromegaly present.  Cardiovascular: Normal rate, regular rhythm, normal heart sounds and intact distal pulses.  Exam reveals no gallop and no friction rub.   No murmur heard. Pulmonary/Chest: Effort normal and breath sounds normal. No stridor. No respiratory distress. He has no wheezes. He has no rales. He exhibits no tenderness.  Abdominal: Soft. Normal appearance and bowel sounds are normal. He exhibits no shifting dullness, no distension, no pulsatile liver, no fluid wave, no abdominal bruit, no ascites, no pulsatile midline mass and no mass. There is no hepatosplenomegaly, splenomegaly or hepatomegaly. There is tenderness in the left lower quadrant. There is no rebound, no CVA tenderness, no tenderness at McBurney's point and negative Murphy's sign. No hernia. Hernia confirmed negative in the ventral area, confirmed negative in the right inguinal area and confirmed negative in the left inguinal area.  Genitourinary: Rectum normal, prostate normal, testes normal and penis normal. Rectal exam shows no external hemorrhoid, no internal hemorrhoid, no fissure, no mass, no tenderness and anal tone normal. Guaiac negative stool. Prostate is not enlarged and not tender. Right testis shows no mass, no swelling and no tenderness. Right testis is descended. Left testis shows no  mass, no  swelling and no tenderness. Left testis is descended. Circumcised. No penile erythema or penile tenderness. No discharge found.  Musculoskeletal: Normal range of motion. He exhibits no edema or tenderness.  Lymphadenopathy:    He has no cervical adenopathy.       Right: No inguinal adenopathy present.       Left: No inguinal adenopathy present.  Neurological: He is oriented to person, place, and time.  Skin: Skin is warm and dry. No rash noted. He is not diaphoretic. No erythema. No pallor.  Vitals reviewed.   Lab Results  Component Value Date   WBC 16.1* 01/25/2014   HGB 15.4 01/25/2014   HCT 45.8 01/25/2014   PLT 235.0 01/25/2014   GLUCOSE 91 01/25/2014   CHOL 225* 09/14/2013   TRIG 275.0* 09/14/2013   HDL 38.60* 09/14/2013   LDLDIRECT 162.9 09/14/2013   ALT 24 01/25/2014   AST 20 01/25/2014   NA 137 01/25/2014   K 4.2 01/25/2014   CL 102 01/25/2014   CREATININE 0.88 01/25/2014   BUN 14 01/25/2014   CO2 29 01/25/2014   TSH 1.60 09/14/2013   PSA 0.75 09/14/2013        Assessment & Plan:

## 2014-01-25 NOTE — Assessment & Plan Note (Signed)
Will treat with cipro and flagyl

## 2014-01-25 NOTE — Patient Instructions (Signed)

## 2014-09-23 ENCOUNTER — Telehealth: Payer: Self-pay

## 2014-09-23 ENCOUNTER — Other Ambulatory Visit: Payer: Self-pay

## 2014-09-23 NOTE — Telephone Encounter (Signed)
Incoming fax for Rf. Last OV 01/25/2014

## 2014-10-03 ENCOUNTER — Other Ambulatory Visit: Payer: Self-pay

## 2014-10-03 NOTE — Telephone Encounter (Signed)
01/25/14 LAst OV ok to fill?

## 2014-10-06 MED ORDER — ATORVASTATIN CALCIUM 10 MG PO TABS: 10.0000 mg | ORAL_TABLET | Freq: Every day | ORAL | Status: DC

## 2014-10-22 ENCOUNTER — Encounter: Payer: Self-pay | Admitting: Internal Medicine

## 2014-10-22 ENCOUNTER — Other Ambulatory Visit (INDEPENDENT_AMBULATORY_CARE_PROVIDER_SITE_OTHER): Payer: BLUE CROSS/BLUE SHIELD

## 2014-10-22 ENCOUNTER — Ambulatory Visit (INDEPENDENT_AMBULATORY_CARE_PROVIDER_SITE_OTHER): Payer: BLUE CROSS/BLUE SHIELD | Admitting: Internal Medicine

## 2014-10-22 VITALS — BP 118/80 | HR 60 | Temp 97.6°F | Resp 16 | Ht 73.0 in | Wt 194.0 lb

## 2014-10-22 DIAGNOSIS — K4090 Unilateral inguinal hernia, without obstruction or gangrene, not specified as recurrent: Secondary | ICD-10-CM

## 2014-10-22 DIAGNOSIS — Z0000 Encounter for general adult medical examination without abnormal findings: Secondary | ICD-10-CM

## 2014-10-22 LAB — URINALYSIS, ROUTINE W REFLEX MICROSCOPIC
BILIRUBIN URINE: NEGATIVE
HGB URINE DIPSTICK: NEGATIVE
Ketones, ur: NEGATIVE
LEUKOCYTES UA: NEGATIVE
Nitrite: NEGATIVE
Specific Gravity, Urine: 1.02 (ref 1.000–1.030)
TOTAL PROTEIN, URINE-UPE24: NEGATIVE
UROBILINOGEN UA: 0.2 (ref 0.0–1.0)
Urine Glucose: NEGATIVE
WBC, UA: NONE SEEN (ref 0–?)
pH: 6 (ref 5.0–8.0)

## 2014-10-22 LAB — COMPREHENSIVE METABOLIC PANEL
ALBUMIN: 4.8 g/dL (ref 3.5–5.2)
ALK PHOS: 64 U/L (ref 39–117)
ALT: 23 U/L (ref 0–53)
AST: 21 U/L (ref 0–37)
BILIRUBIN TOTAL: 0.8 mg/dL (ref 0.2–1.2)
BUN: 13 mg/dL (ref 6–23)
CO2: 32 mEq/L (ref 19–32)
CREATININE: 0.91 mg/dL (ref 0.40–1.50)
Calcium: 9.8 mg/dL (ref 8.4–10.5)
Chloride: 102 mEq/L (ref 96–112)
GFR: 92.2 mL/min (ref >60.00)
GLUCOSE: 93 mg/dL (ref 70–99)
POTASSIUM: 4.4 meq/L (ref 3.5–5.1)
SODIUM: 141 meq/L (ref 135–145)
TOTAL PROTEIN: 7.8 g/dL (ref 6.0–8.3)

## 2014-10-22 LAB — CBC WITH DIFFERENTIAL/PLATELET
Basophils Absolute: 0 10*3/uL (ref 0.0–0.1)
Basophils Relative: 0.5 % (ref 0.0–3.0)
EOS ABS: 0.2 10*3/uL (ref 0.0–0.7)
Eosinophils Relative: 2.2 % (ref 0.0–5.0)
HCT: 48.5 % (ref 39.0–52.0)
HEMOGLOBIN: 16.3 g/dL (ref 13.0–17.0)
LYMPHS ABS: 2 10*3/uL (ref 0.7–4.0)
Lymphocytes Relative: 19.9 % (ref 12.0–46.0)
MCHC: 33.6 g/dL (ref 30.0–36.0)
MCV: 91 fl (ref 78.0–100.0)
MONO ABS: 0.4 10*3/uL (ref 0.1–1.0)
Monocytes Relative: 3.8 % (ref 3.0–12.0)
NEUTROS PCT: 73.6 % (ref 43.0–77.0)
Neutro Abs: 7.3 10*3/uL (ref 1.4–7.7)
Platelets: 228 10*3/uL (ref 150.0–400.0)
RBC: 5.34 Mil/uL (ref 4.22–5.81)
RDW: 13.1 % (ref 11.5–15.5)
WBC: 10 10*3/uL (ref 4.0–10.5)

## 2014-10-22 LAB — LIPID PANEL
CHOLESTEROL: 177 mg/dL (ref 0–200)
HDL: 52.5 mg/dL (ref >39.00)
LDL Cholesterol: 106 mg/dL — ABNORMAL HIGH (ref 0–99)
NonHDL: 124
Total CHOL/HDL Ratio: 3
Triglycerides: 92 mg/dL (ref 0.0–149.0)
VLDL: 18.4 mg/dL (ref 0.0–40.0)

## 2014-10-22 LAB — TSH: TSH: 1.31 u[IU]/mL (ref 0.35–4.50)

## 2014-10-22 LAB — PSA: PSA: 0.91 ng/mL (ref 0.10–4.00)

## 2014-10-22 LAB — FECAL OCCULT BLOOD, GUAIAC: FECAL OCCULT BLD: NEGATIVE

## 2014-10-22 NOTE — Progress Notes (Signed)
Pre visit review using our clinic review tool, if applicable. No additional management support is needed unless otherwise documented below in the visit note. 

## 2014-10-22 NOTE — Patient Instructions (Signed)

## 2014-10-23 ENCOUNTER — Encounter: Payer: Self-pay | Admitting: Internal Medicine

## 2014-10-23 LAB — HEPATITIS C ANTIBODY: HCV AB: NEGATIVE

## 2014-10-23 NOTE — Addendum Note (Signed)
Addended by: Janith Lima on: 10/23/2014 01:18 PM   Modules accepted: Miquel Dunn

## 2014-10-23 NOTE — Progress Notes (Addendum)
Subjective:  Patient ID: Daniel Allen, male    DOB: 12-02-1960  Age: 54 y.o. MRN: [id number]  CC: Annual Exam   HPI Daniel Allen presents for a CPX - he remains concerned about a right inguinal hernia that has been bulging for 3 weeks, He saw a surgeon about this 2 years ago but at that time he decided not to have the surgery, he wants to go back to see another surgeon for a second opinion.  Outpatient Prescriptions Prior to Visit  Medication Sig Dispense Refill  . aspirin EC 81 MG tablet Take 1 tablet (81 mg total) by mouth daily. 90 tablet 11  . atorvastatin (LIPITOR) 10 MG tablet Take 1 tablet (10 mg total) by mouth daily. 90 tablet 0   No facility-administered medications prior to visit.    ROS Review of Systems  Constitutional: Negative.  Negative for fever, chills, diaphoresis, appetite change and fatigue.  HENT: Negative.   Eyes: Negative.   Respiratory: Negative.  Negative for cough, choking, chest tightness, shortness of breath and stridor.   Cardiovascular: Negative.  Negative for chest pain, palpitations and leg swelling.  Gastrointestinal: Negative.  Negative for nausea, vomiting, abdominal pain, diarrhea, constipation and blood in stool.  Endocrine: Negative.   Genitourinary: Negative.  Negative for flank pain and difficulty urinating.  Musculoskeletal: Negative.   Allergic/Immunologic: Negative.   Neurological: Negative.   Hematological: Negative.   Psychiatric/Behavioral: Negative.     Objective:  BP 118/80 mmHg  Pulse 60  Temp(Src) 97.6 F (36.4 C) (Oral)  Resp 16  Ht 6' 1"  (1.854 m)  Wt 194 lb (87.998 kg)  BMI 25.60 kg/m2  SpO2 97%  BP Readings from Last 3 Encounters:  10/22/14 118/80  01/25/14 122/84  09/14/13 126/86    Wt Readings from Last 3 Encounters:  10/22/14 194 lb (87.998 kg)  01/25/14 190 lb (86.183 kg)  09/14/13 194 lb (87.998 kg)    Physical Exam  Constitutional: He is oriented to person, place, and time. He appears  well-developed and well-nourished. No distress.  HENT:  Head: Normocephalic and atraumatic.  Mouth/Throat: Oropharynx is clear and moist. No oropharyngeal exudate.  Eyes: Conjunctivae are normal. Right eye exhibits no discharge. Left eye exhibits no discharge. No scleral icterus.  Neck: Normal range of motion. Neck supple. No JVD present. No tracheal deviation present. No thyromegaly present.  Cardiovascular: Normal rate, regular rhythm, normal heart sounds and intact distal pulses.  Exam reveals no gallop and no friction rub.   No murmur heard. EKG - NSR, normal  Pulmonary/Chest: Effort normal and breath sounds normal. No stridor. No respiratory distress. He has no wheezes. He has no rales. He exhibits no tenderness.  Abdominal: Soft. Bowel sounds are normal. He exhibits no distension and no mass. There is no tenderness. There is no rebound and no guarding. A hernia is present. Hernia confirmed positive in the right inguinal area. Hernia confirmed negative in the left inguinal area.  Genitourinary: Rectum normal, prostate normal, testes normal and penis normal. Rectal exam shows no external hemorrhoid, no internal hemorrhoid, no fissure, no mass, no tenderness and anal tone normal. Guaiac negative stool.    Prostate is not enlarged and not tender. Right testis shows no mass, no swelling and no tenderness. Right testis is descended. Left testis shows no mass, no swelling and no tenderness. Left testis is descended. Circumcised. No penile erythema or penile tenderness. No discharge found.  Musculoskeletal: Normal range of motion. He exhibits no edema or tenderness.  Lymphadenopathy:    He has no cervical adenopathy.       Right: No inguinal adenopathy present.       Left: No inguinal adenopathy present.  Neurological: He is oriented to person, place, and time.  Skin: Skin is warm and dry. No rash noted. He is not diaphoretic. No erythema. No pallor.  Psychiatric: He has a normal mood and affect.  His behavior is normal. Judgment and thought content normal.  Vitals reviewed.   Lab Results  Component Value Date   WBC 10.0 10/22/2014   HGB 16.3 10/22/2014   HCT 48.5 10/22/2014   PLT 228.0 10/22/2014   GLUCOSE 93 10/22/2014   CHOL 177 10/22/2014   TRIG 92.0 10/22/2014   HDL 52.50 10/22/2014   LDLDIRECT 162.9 09/14/2013   LDLCALC 106* 10/22/2014   ALT 23 10/22/2014   AST 21 10/22/2014   NA 141 10/22/2014   K 4.4 10/22/2014   CL 102 10/22/2014   CREATININE 0.91 10/22/2014   BUN 13 10/22/2014   CO2 32 10/22/2014   TSH 1.31 10/22/2014   PSA 0.91 10/22/2014    No results found.  Assessment & Plan:   Daniel Allen was seen today for annual exam.  Diagnoses and all orders for this visit:  Routine general medical examination at a health care facility- exam done, labs reviewed, vaccines were reviewed and updated, pt ed material was given -     Lipid panel; Future -     Comprehensive metabolic panel; Future -     CBC with Differential/Platelet; Future -     PSA; Future -     TSH; Future -     Urinalysis, Routine w reflex microscopic (not at [facility]); Future -     Hepatitis C antibody; Future -     EKG 12-Lead  Inguinal hernia, right -     Ambulatory referral to General Surgery   I am having Daniel Allen maintain his aspirin EC and atorvastatin.  No orders of the defined types were placed in this encounter.     Follow-up: Return if symptoms worsen or fail to improve.  Scarlette Calico, MD

## 2014-11-08 ENCOUNTER — Other Ambulatory Visit: Payer: Self-pay

## 2014-11-08 MED ORDER — TRIAMCINOLONE ACETONIDE 0.1 % EX CREA: TOPICAL_CREAM | Freq: Two times a day (BID) | CUTANEOUS | Status: DC

## 2015-01-03 ENCOUNTER — Other Ambulatory Visit: Payer: Self-pay | Admitting: Internal Medicine

## 2015-02-13 ENCOUNTER — Ambulatory Visit: Payer: Self-pay | Admitting: General Surgery

## 2015-02-18 ENCOUNTER — Other Ambulatory Visit (INDEPENDENT_AMBULATORY_CARE_PROVIDER_SITE_OTHER): Payer: BLUE CROSS/BLUE SHIELD

## 2015-02-18 ENCOUNTER — Encounter: Payer: Self-pay | Admitting: Internal Medicine

## 2015-02-18 ENCOUNTER — Ambulatory Visit (INDEPENDENT_AMBULATORY_CARE_PROVIDER_SITE_OTHER): Payer: BLUE CROSS/BLUE SHIELD | Admitting: Internal Medicine

## 2015-02-18 VITALS — BP 122/68 | HR 68 | Temp 97.9°F | Resp 16 | Ht 73.0 in | Wt 195.0 lb

## 2015-02-18 DIAGNOSIS — I491 Atrial premature depolarization: Secondary | ICD-10-CM

## 2015-02-18 DIAGNOSIS — R072 Precordial pain: Secondary | ICD-10-CM | POA: Diagnosis not present

## 2015-02-18 LAB — CBC WITH DIFFERENTIAL/PLATELET
BASOS ABS: 0 10*3/uL (ref 0.0–0.1)
Basophils Relative: 0.6 % (ref 0.0–3.0)
EOS ABS: 0.2 10*3/uL (ref 0.0–0.7)
Eosinophils Relative: 3.3 % (ref 0.0–5.0)
HEMATOCRIT: 48.9 % (ref 39.0–52.0)
HEMOGLOBIN: 16.4 g/dL (ref 13.0–17.0)
LYMPHS PCT: 21.5 % (ref 12.0–46.0)
Lymphs Abs: 1.5 10*3/uL (ref 0.7–4.0)
MCHC: 33.6 g/dL (ref 30.0–36.0)
MCV: 90.3 fl (ref 78.0–100.0)
MONOS PCT: 4.9 % (ref 3.0–12.0)
Monocytes Absolute: 0.3 10*3/uL (ref 0.1–1.0)
Neutro Abs: 4.8 10*3/uL (ref 1.4–7.7)
Neutrophils Relative %: 69.7 % (ref 43.0–77.0)
PLATELETS: 207 10*3/uL (ref 150.0–400.0)
RBC: 5.42 Mil/uL (ref 4.22–5.81)
RDW: 13.3 % (ref 11.5–15.5)
WBC: 6.9 10*3/uL (ref 4.0–10.5)

## 2015-02-18 LAB — BASIC METABOLIC PANEL
BUN: 15 mg/dL (ref 6–23)
CHLORIDE: 104 meq/L (ref 96–112)
CO2: 28 meq/L (ref 19–32)
CREATININE: 1.03 mg/dL (ref 0.40–1.50)
Calcium: 9.9 mg/dL (ref 8.4–10.5)
GFR: 79.82 mL/min (ref >60.00)
GLUCOSE: 90 mg/dL (ref 70–99)
POTASSIUM: 4.3 meq/L (ref 3.5–5.1)
Sodium: 142 mEq/L (ref 135–145)

## 2015-02-18 LAB — CARDIAC PANEL
CK TOTAL: 67 U/L (ref 7–232)
CK-MB: 1.5 ng/mL (ref 0.3–4.0)
RELATIVE INDEX: 2.3 calc (ref 0.0–2.5)

## 2015-02-18 LAB — TSH: TSH: 1.49 u[IU]/mL (ref 0.35–4.50)

## 2015-02-18 LAB — T4, FREE: FREE T4: 0.87 ng/dL (ref 0.60–1.60)

## 2015-02-18 LAB — TROPONIN I: TNIDX: 0 ug/l (ref 0.00–0.06)

## 2015-02-18 NOTE — Progress Notes (Signed)
Subjective:  Patient ID: Daniel Allen, male    DOB: 01-24-1960  Age: 55 y.o. MRN: [id number]  CC: Chest Pain and Palpitations   HPI Daniel Allen presents for the complaint of chest tightness and palpitations for several weeks. He describes the chest pain as only occurring at rest. He is exercising at the gym and does not experience any dyspnea on exertion, dizziness, diaphoresis, nausea, or weakness, or DOE. He complains of a vague sensation of shortness of breath that he describes as anxiety within his chest. He has also had a few episodes of palpitations.  Outpatient Prescriptions Prior to Visit  Medication Sig Dispense Refill  . aspirin EC 81 MG tablet Take 1 tablet (81 mg total) by mouth daily. 90 tablet 11  . atorvastatin (LIPITOR) 10 MG tablet TAKE 1 TABLET(10 MG) BY MOUTH DAILY 90 tablet 2  . triamcinolone cream (KENALOG) 0.1 % Apply topically 2 (two) times daily. (Patient not taking: Reported on 02/18/2015) 30 g 0   No facility-administered medications prior to visit.    ROS Review of Systems  Constitutional: Negative.  Negative for fever, chills, diaphoresis, appetite change and fatigue.  HENT: Negative.  Negative for facial swelling, sinus pressure, sore throat and trouble swallowing.   Eyes: Negative.   Respiratory: Positive for shortness of breath. Negative for apnea, cough, choking, wheezing and stridor.   Cardiovascular: Positive for chest pain and palpitations. Negative for leg swelling.  Gastrointestinal: Negative.  Negative for nausea, vomiting, abdominal pain, diarrhea, constipation and blood in stool.  Endocrine: Negative.   Genitourinary: Negative.   Musculoskeletal: Negative.  Negative for myalgias, back pain, arthralgias, neck pain and neck stiffness.  Skin: Negative.  Negative for color change and rash.  Allergic/Immunologic: Negative.   Neurological: Negative.  Negative for dizziness, tremors, weakness and light-headedness.  Hematological: Negative.   Negative for adenopathy. Does not bruise/bleed easily.  Psychiatric/Behavioral: Negative.     Objective:  BP 122/68 mmHg  Pulse 68  Temp(Src) 97.9 F (36.6 C) (Oral)  Resp 16  Ht 6' 1"  (1.854 m)  Wt 195 lb (88.451 kg)  BMI 25.73 kg/m2  SpO2 98%  BP Readings from Last 3 Encounters:  02/18/15 122/68  10/22/14 118/80  01/25/14 122/84    Wt Readings from Last 3 Encounters:  02/18/15 195 lb (88.451 kg)  10/22/14 194 lb (87.998 kg)  01/25/14 190 lb (86.183 kg)    Physical Exam  Constitutional: He is oriented to person, place, and time. He appears well-developed and well-nourished. No distress.  HENT:  Head: Normocephalic and atraumatic.  Mouth/Throat: Oropharynx is clear and moist. No oropharyngeal exudate.  Eyes: Conjunctivae are normal. Right eye exhibits no discharge. Left eye exhibits no discharge. No scleral icterus.  Neck: Normal range of motion. Neck supple. No JVD present. No tracheal deviation present. No thyromegaly present.  Cardiovascular: Normal rate, regular rhythm, S2 normal, normal heart sounds and intact distal pulses.   Occasional extrasystoles are present. Exam reveals no gallop and no friction rub.   No murmur heard. Pulses:      Carotid pulses are 1+ on the right side, and 1+ on the left side.      Radial pulses are 1+ on the right side, and 1+ on the left side.       Femoral pulses are 1+ on the right side, and 1+ on the left side.      Popliteal pulses are 1+ on the right side, and 1+ on the left side.  Dorsalis pedis pulses are 1+ on the right side, and 1+ on the left side.       Posterior tibial pulses are 1+ on the right side, and 1+ on the left side.  EKG -  Sinus arrhythmia some artifact no Q waves no ST/T wave changes  Pulmonary/Chest: Effort normal and breath sounds normal. No stridor. No respiratory distress. He has no wheezes. He has no rales. He exhibits no tenderness.  Abdominal: Soft. Bowel sounds are normal. He exhibits no  distension and no mass. There is no tenderness. There is no rebound and no guarding.  Musculoskeletal: Normal range of motion. He exhibits no edema or tenderness.  Lymphadenopathy:    He has no cervical adenopathy.  Neurological: He is oriented to person, place, and time.  Skin: Skin is warm and dry. No rash noted. He is not diaphoretic. No erythema. No pallor.  Psychiatric: He has a normal mood and affect. His behavior is normal. Judgment and thought content normal.  Vitals reviewed.   Lab Results  Component Value Date   WBC 6.9 02/18/2015   HGB 16.4 02/18/2015   HCT 48.9 02/18/2015   PLT 207.0 02/18/2015   GLUCOSE 90 02/18/2015   CHOL 177 10/22/2014   TRIG 92.0 10/22/2014   HDL 52.50 10/22/2014   LDLDIRECT 162.9 09/14/2013   LDLCALC 106* 10/22/2014   ALT 23 10/22/2014   AST 21 10/22/2014   NA 142 02/18/2015   K 4.3 02/18/2015   CL 104 02/18/2015   CREATININE 1.03 02/18/2015   BUN 15 02/18/2015   CO2 28 02/18/2015   TSH 1.49 02/18/2015   PSA 0.91 10/22/2014    No results found.  Assessment & Plan:   Daniel Allen was seen today for chest pain and palpitations.  Diagnoses and all orders for this visit:  Precordial pain- his chest pain does not sound cardiac, his EKG does not show any signs of MI or ischemia, his troponin and cardiac panel are negative, the pain seems to be more related to anxiety. He does appear to be symptomatic from the PAC's so I've asked him to see cardiology. -     EKG 12-Lead -     CBC with Differential/Platelet; Future -     Troponin I; Future -     Cardiac panel; Future  PAC (premature atrial contraction)- this is a long-standing, chronic finding for him. His EKG today does show some sinus arrhythmia but no serious dysrhythmia. He does appear to be coming symptomatic with this so Avastin to see cardiology for further evaluation. -     Ambulatory referral to Cardiology -     Basic metabolic panel; Future -     T4, free; Future -     TSH;  Future   I am having Daniel Allen maintain his aspirin EC, triamcinolone cream, atorvastatin, and mupirocin ointment.  Meds ordered this encounter  Medications  . mupirocin ointment (BACTROBAN) 2 %    Sig: APPLY ON THE SKIN DAILY    Refill:  0     Follow-up: Return in about 6 weeks (around 04/01/2015).  Scarlette Calico, MD

## 2015-02-18 NOTE — Patient Instructions (Signed)
Premature Atrial Contraction Premature atrial contractions (PACs) happen when your heart beats before it has had time to fill with blood. Your heart then has to pause until it can fill with blood for the next beat. This causes the next beat to be more forceful. PACs are also called skipped heartbeats because it may feel like your heart stops for a second.  Your heart has four chambers. There are two upper chambers (atria) and two lower chambers (ventricles). All the chambers need to work together to pump blood properly. Electrical signals spread across your heart and make all the chambers beat together.The signal to beat starts in your atria. If the atria fire a bit early, you may have a PAC.  CAUSES  The cause of a PAC is often unknown. PACs are sometimes caused by heart disease or injury. RISK FACTORS PACs are more common in children and older people. Other risk factors that may trigger PACs include:  Caffeine.  Stress.  Fatigue.  Alcohol.  Smoking.  Stimulant drugs. These may be prescription or illegal drugs.  Heart disease. SIGNS AND SYMPTOMS PACs are very common, especially in children and people 50 years and older. PACs do not cause dizziness, shortness of breath, or chest pain. The only symptom of a PAC is the sensation of a skipped or fluttering heartbeat.  DIAGNOSIS  Your health care provider can diagnose PAC based on the description of your symptom. Your health care provider may also:  Perform a physical exam to listen to your heart. Your heart may sound normal during this exam.  Perform tests to rule out other conditions. These tests may include an electrical tracing of your heart called electrocardiogram (ECG). You may need to wear a portable ECG machine (Holter monitor) that records your heart for 24 hours or more. TREATMENT  In most cases, PACs do not need to be treated. If you have frequent PACs that are caused by heart disease, you may be treated for the underlying  condition. HOME CARE INSTRUCTIONS  Do not use any tobacco products, including cigarettes, chewing tobacco, or electronic cigarettes. If you need help quitting, ask your health care provider.  Limit alcohol intake to no more than 1 drink per day for nonpregnant women and 2 drinks per day for men. One drink equals 12 ounces of beer, 5 ounces of wine, or 1 ounces of hard liquor.  Limit the amount of caffeine you take in.  Do not use illegal drugs.  Get at least 8 hours of sleep every night.  Find healthy ways to manage stress.  Get regular exercise. Ask your health care provider to suggest some activities that are safe for you. SEEK MEDICAL CARE IF:  You feel your heart skipping beats often (more than once a day).  Your heart skips beats and you feel dizzy, lightheaded, or very tired. SEEK IMMEDIATE MEDICAL CARE IF:   You have chest pain.  You have trouble breathing.   This information is not intended to replace advice given to you by your health care provider. Make sure you discuss any questions you have with your health care provider.   Document Released: 08/31/2013 Document Revised: 05/14/2014 Document Reviewed: 08/31/2013 Elsevier Interactive Patient Education [employer].

## 2015-02-18 NOTE — Progress Notes (Signed)
Pre visit review using our clinic review tool, if applicable. No additional management support is needed unless otherwise documented below in the visit note. 

## 2015-02-19 ENCOUNTER — Encounter: Payer: Self-pay | Admitting: Internal Medicine

## 2015-03-04 ENCOUNTER — Ambulatory Visit (INDEPENDENT_AMBULATORY_CARE_PROVIDER_SITE_OTHER): Payer: BLUE CROSS/BLUE SHIELD | Admitting: Cardiology

## 2015-03-04 ENCOUNTER — Encounter: Payer: Self-pay | Admitting: Cardiology

## 2015-03-04 VITALS — BP 126/82 | HR 74 | Ht 73.0 in | Wt 193.4 lb

## 2015-03-04 DIAGNOSIS — I491 Atrial premature depolarization: Secondary | ICD-10-CM

## 2015-03-04 DIAGNOSIS — R079 Chest pain, unspecified: Secondary | ICD-10-CM | POA: Diagnosis not present

## 2015-03-04 DIAGNOSIS — R072 Precordial pain: Secondary | ICD-10-CM

## 2015-03-04 DIAGNOSIS — Z8249 Family history of ischemic heart disease and other diseases of the circulatory system: Secondary | ICD-10-CM

## 2015-03-04 DIAGNOSIS — E785 Hyperlipidemia, unspecified: Secondary | ICD-10-CM

## 2015-03-04 MED ORDER — ATORVASTATIN CALCIUM 10 MG PO TABS: ORAL_TABLET | ORAL | Status: DC

## 2015-03-04 NOTE — Patient Instructions (Signed)
Medication Instructions:  The current medical regimen is effective;  continue present plan and medications.  Testing/Procedures: Your physician has requested that you have an exercise tolerance test. For further information please visit [email or web address]. Please also follow instruction sheet, as given.  Follow-Up: Follow up as needed with Dr Marlou Porch.  If you need a refill on your cardiac medications before your next appointment, please call your pharmacy.  Thank you for choosing [facility]!!

## 2015-03-04 NOTE — Progress Notes (Signed)
.    Cardiology Office Note    Date:  03/04/2015   ID:  Daniel Allen, DOB [birth year], MRN [id number]  PCP:  Scarlette Calico, MD  Cardiologist:   Candee Furbish, MD     History of Present Illness:  Daniel Allen is a 55 y.o. male Here for evaluation of irregular heartbeat. He also complained of complaints of chest tightness and palpitations for several weeks. That prompted his visit with his primary physician Dr. Ronnald Ramp. Over the past 2-3 weeks however these have subsided. He does feel as though they correlate around periods of stress, anxiety or periods of increased alcohol use. No dizziness, no dyspnea on exertion, no weakness.   His father had bypass surgery in his mid 61s.  He has mildly elevated cholesterol, taking low-dose atorvastatin., Crohn's colitis as well.  EKG was performed on 02/18/15 which showed sinus rhythm with PACs. No evidence of atrial fibrillation.CK and MB were normal. Creatinine was 1.03, LDL 106.  He works as a Hydrologist for several different companies.    Past Medical History  Diagnosis Date  . Hx of colonic polyps   . Regional enteritis of unspecified site     Crohn's Colitis  . Inguinal hernia     right  . Other and unspecified hyperlipidemia 09/18/2013    Past Surgical History  Procedure Laterality Date  . Colonoscopy    . Inguinal hernia repair Right 2000    Outpatient Prescriptions Prior to Visit  Medication Sig Dispense Refill  . aspirin EC 81 MG tablet Take 1 tablet (81 mg total) by mouth daily. 90 tablet 11  . mupirocin ointment (BACTROBAN) 2 % APPLY ON THE SKIN DAILY  0  . atorvastatin (LIPITOR) 10 MG tablet TAKE 1 TABLET(10 MG) BY MOUTH DAILY 90 tablet 2  . triamcinolone cream (KENALOG) 0.1 % Apply topically 2 (two) times daily. (Patient not taking: Reported on 03/04/2015) 30 g 0   No facility-administered medications prior to visit.     Allergies:   Review of patient's allergies indicates no known allergies.   Social History   Social History   . Marital Status: Married    Spouse Name: N/A  . Number of Children: 1  . Years of Education: N/A   Occupational History  . CFO    Social History Main Topics  . Smoking status: Never Smoker   . Smokeless tobacco: Never Used  . Alcohol Use: 1.8 oz/week    3 Glasses of wine per week     Comment: drinks per week  . Drug Use: No  . Sexual Activity: Yes   Other Topics Concern  . None   Social History Narrative     Family History:  The patient's family history includes Cancer in his father; Heart disease in his father; Parkinson's disease in his mother. There is no history of Colon cancer, Esophageal cancer, Stomach cancer, Alcohol abuse, Diabetes, Early death, Hyperlipidemia, Hypertension, or Stroke.   Father CABG in mid 22's.  ROS:   Please see the history of present illness.    ROS All other systems reviewed and are negative.   PHYSICAL EXAM:   VS:  BP 126/82 mmHg  Pulse 74  Ht 6' 1"  (1.854 m)  Wt 193 lb 6.4 oz (87.726 kg)  BMI 25.52 kg/m2  SpO2 98%   GEN: Well nourished, well developed, in no acute distress HEENT: normal Neck: no JVD, carotid bruits, or masses Cardiac: RRR; rare ectopy,no murmurs, rubs, or gallops,no edema  Respiratory:  clear to auscultation bilaterally, normal work of breathing GI: soft, nontender, nondistended, + BS MS: no deformity or atrophy Skin: warm and dry, no rash Neuro:  Alert and Oriented x 3, Strength and sensation are intact Psych: euthymic mood, full affect  Wt Readings from Last 3 Encounters:  03/04/15 193 lb 6.4 oz (87.726 kg)  02/18/15 195 lb (88.451 kg)  10/22/14 194 lb (87.998 kg)      Studies/Labs Reviewed:   EKG:  EKG is not ordered today.    Recent Labs: 10/22/2014: ALT 23 02/18/2015: BUN 15; Creatinine, Ser 1.03; Hemoglobin 16.4; Platelets 207.0; Potassium 4.3; Sodium 142; TSH 1.49   Lipid Panel    Component Value Date/Time   CHOL 177 10/22/2014 1522   TRIG 92.0 10/22/2014 1522   HDL 52.50 10/22/2014 1522    CHOLHDL 3 10/22/2014 1522   VLDL 18.4 10/22/2014 1522   LDLCALC 106* 10/22/2014 1522   LDLDIRECT 162.9 09/14/2013 1657    Additional studies/ records that were reviewed today include:  Prior office records, lab work, EKGs reviewed. LDL 106, HDL 52, creatinine 1.03, hemoglobin 16    ASSESSMENT:    1. Chest pain, unspecified chest pain type   2. PAC (premature atrial contraction)   3. Precordial pain      PLAN:  In order of problems listed above:  1. Atypical chest pain-thankfully this seems to have improved however given his family history with his father having bypass surgery in his mid 57s I will check an exercise treadmill test to ensure that there is no evidence of ischemia. He will let me know if any symptoms worsen. Continue with aggressive primary prevention. 2. Hyperlipidemia-agree with low-dose atorvastatin. Excellent. 3. Palpitations-seem to correlate with premature atrial contractions seen on EKG. We discussed reduction of caffeine, trying to avoid decongestant such as Sudafed. He knows that alcohol can play a role. PACs are benign. Should not be any cause for concern. If symptoms worsen, he will let me know. We could consider monitoring if necessary.    Medication Adjustments/Labs and Tests Ordered: Current medicines are reviewed at length with the patient today.  Concerns regarding medicines are outlined above.  Medication changes, Labs and Tests ordered today are listed in the Patient Instructions below. Patient Instructions  Medication Instructions:  The current medical regimen is effective;  continue present plan and medications.  Testing/Procedures: Your physician has requested that you have an exercise tolerance test. For further information please visit [email or web address]. Please also follow instruction sheet, as given.  Follow-Up: Follow up as needed with Dr Marlou Porch.  If you need a refill on your cardiac medications before your next appointment, please call  your pharmacy.  Thank you for choosing [facility]!!             Signed, Candee Furbish, MD  03/04/2015 12:03 PM    [facility] [address], [place], [place]  [address] Phone: [phone]; Fax: [phone]

## 2015-03-13 ENCOUNTER — Ambulatory Visit (INDEPENDENT_AMBULATORY_CARE_PROVIDER_SITE_OTHER): Payer: BLUE CROSS/BLUE SHIELD

## 2015-03-13 DIAGNOSIS — R079 Chest pain, unspecified: Secondary | ICD-10-CM

## 2015-03-13 LAB — EXERCISE TOLERANCE TEST
CHL CUP STRESS STAGE 1 GRADE: 0 %
CHL CUP STRESS STAGE 1 SPEED: 0 mph
CHL CUP STRESS STAGE 2 SPEED: 1 mph
CHL CUP STRESS STAGE 3 HR: 89 {beats}/min
CHL CUP STRESS STAGE 3 SPEED: 1 mph
CHL CUP STRESS STAGE 4 DBP: 95 mmHg
CHL CUP STRESS STAGE 4 SPEED: 1.7 mph
CHL CUP STRESS STAGE 6 GRADE: 14 %
CHL CUP STRESS STAGE 6 SPEED: 3.4 mph
CHL CUP STRESS STAGE 7 GRADE: 16 %
CHL CUP STRESS STAGE 8 DBP: 86 mmHg
CHL CUP STRESS STAGE 8 GRADE: 0 %
CHL CUP STRESS STAGE 8 HR: 136 {beats}/min
CHL CUP STRESS STAGE 8 SPEED: 1.5 mph
CHL CUP STRESS STAGE 9 DBP: 88 mmHg
CHL CUP STRESS STAGE 9 GRADE: 0 %
CHL CUP STRESS STAGE 9 HR: 90 {beats}/min
CHL CUP STRESS STAGE 9 SBP: 126 mmHg
CSEPED: 10 min
CSEPEDS: 45 s
CSEPEW: 12.9 METS
CSEPPHR: 162 {beats}/min
CSEPPMHR: 97 %
MPHR: 166 {beats}/min
Percent HR: 98 %
RPE: 17
Rest HR: 61 {beats}/min
Stage 1 DBP: 95 mmHg
Stage 1 HR: 73 {beats}/min
Stage 1 SBP: 152 mmHg
Stage 2 Grade: 0 %
Stage 2 HR: 89 {beats}/min
Stage 3 Grade: 0 %
Stage 4 Grade: 10 %
Stage 4 HR: 92 {beats}/min
Stage 4 SBP: 166 mmHg
Stage 5 DBP: 79 mmHg
Stage 5 Grade: 12 %
Stage 5 HR: 110 {beats}/min
Stage 5 SBP: 174 mmHg
Stage 5 Speed: 2.5 mph
Stage 6 DBP: 86 mmHg
Stage 6 HR: 133 {beats}/min
Stage 6 SBP: 178 mmHg
Stage 7 HR: 162 {beats}/min
Stage 7 Speed: 4.2 mph
Stage 8 SBP: 153 mmHg
Stage 9 Speed: 0 mph

## 2015-03-14 ENCOUNTER — Telehealth: Payer: Self-pay | Admitting: Cardiology

## 2015-03-14 NOTE — Telephone Encounter (Signed)
Notified of stress test results.

## 2015-03-14 NOTE — Telephone Encounter (Signed)
New message     Returning a call to the nurse to get stress test results.  Please call after 1:45

## 2015-04-01 ENCOUNTER — Encounter (HOSPITAL_BASED_OUTPATIENT_CLINIC_OR_DEPARTMENT_OTHER): Payer: Self-pay | Admitting: *Deleted

## 2015-04-03 ENCOUNTER — Encounter (HOSPITAL_BASED_OUTPATIENT_CLINIC_OR_DEPARTMENT_OTHER): Payer: Self-pay | Admitting: *Deleted

## 2015-04-03 NOTE — Progress Notes (Addendum)
NPO AFTER MN.  ARRIVE AT 0900.  NEEDS HG. CURRENT EKG IN CHART AND EPIC.  WILL TAKE LIPITOR AM DOS W/ SIPS OF WATER.  WILL DO HIBICLENS SHOWER HS BEFORE AND AM DOS.

## 2015-04-11 ENCOUNTER — Encounter (HOSPITAL_BASED_OUTPATIENT_CLINIC_OR_DEPARTMENT_OTHER): Payer: Self-pay | Admitting: *Deleted

## 2015-04-11 NOTE — Progress Notes (Signed)
To [facility] at 1000-Hg on arrival.Instructed Npo after Mn-Hibiclens shower evening prior and am of surgery

## 2015-04-11 NOTE — Anesthesia Preprocedure Evaluation (Addendum)
Anesthesia Evaluation  Patient identified by MRN, date of birth, ID band Patient awake    Reviewed: Allergy & Precautions, NPO status , Patient's Chart, lab work & pertinent test results  Airway Mallampati: III  TM Distance: >3 FB Neck ROM: Full    Dental  (+) Teeth Intact, Dental Advisory Given   Pulmonary neg pulmonary ROS,    Pulmonary exam normal breath sounds clear to auscultation       Cardiovascular negative cardio ROS   Rhythm:Irregular Rate:Normal  Negative STRESS 03/2015, atrial arhythmia PAC on EKG    Neuro/Psych negative neurological ROS  negative psych ROS   GI/Hepatic negative GI ROS, Neg liver ROS, GERD  Medicated,Chron's   Endo/Other  negative endocrine ROS  Renal/GU negative Renal ROS  negative genitourinary   Musculoskeletal negative musculoskeletal ROS (+)   Abdominal   Peds negative pediatric ROS (+)  Hematology negative hematology ROS (+)   Anesthesia Other Findings   Reproductive/Obstetrics negative OB ROS                         Anesthesia Physical Anesthesia Plan  ASA: II  Anesthesia Plan: General   Post-op Pain Management:    Induction: Intravenous  Airway Management Planned: Oral ETT  Additional Equipment:   Intra-op Plan:   Post-operative Plan: Extubation in OR  Informed Consent: I have reviewed the patients History and Physical, chart, labs and discussed the procedure including the risks, benefits and alternatives for the proposed anesthesia with the patient or authorized representative who has indicated his/her understanding and acceptance.     Plan Discussed with:   Anesthesia Plan Comments:         Anesthesia Quick Evaluation

## 2015-04-15 ENCOUNTER — Ambulatory Visit (HOSPITAL_BASED_OUTPATIENT_CLINIC_OR_DEPARTMENT_OTHER)
Admission: RE | Admit: 2015-04-15 | Discharge: 2015-04-15 | Disposition: A | Discharge status: Home / Self Care | Payer: BLUE CROSS/BLUE SHIELD | Source: Ambulatory Visit | Attending: General Surgery | Admitting: General Surgery

## 2015-04-15 ENCOUNTER — Ambulatory Visit (HOSPITAL_BASED_OUTPATIENT_CLINIC_OR_DEPARTMENT_OTHER): Payer: BLUE CROSS/BLUE SHIELD | Admitting: Anesthesiology

## 2015-04-15 ENCOUNTER — Encounter (HOSPITAL_BASED_OUTPATIENT_CLINIC_OR_DEPARTMENT_OTHER)
Admission: RE | Disposition: A | Discharge status: Home / Self Care | Payer: Self-pay | Source: Ambulatory Visit | Attending: General Surgery

## 2015-04-15 ENCOUNTER — Encounter (HOSPITAL_BASED_OUTPATIENT_CLINIC_OR_DEPARTMENT_OTHER): Payer: Self-pay | Admitting: *Deleted

## 2015-04-15 DIAGNOSIS — K219 Gastro-esophageal reflux disease without esophagitis: Secondary | ICD-10-CM | POA: Insufficient documentation

## 2015-04-15 DIAGNOSIS — K4091 Unilateral inguinal hernia, without obstruction or gangrene, recurrent: Secondary | ICD-10-CM | POA: Diagnosis not present

## 2015-04-15 DIAGNOSIS — K4090 Unilateral inguinal hernia, without obstruction or gangrene, not specified as recurrent: Secondary | ICD-10-CM | POA: Insufficient documentation

## 2015-04-15 DIAGNOSIS — Z7982 Long term (current) use of aspirin: Secondary | ICD-10-CM | POA: Insufficient documentation

## 2015-04-15 HISTORY — DX: Other specified postprocedural states: Z98.890

## 2015-04-15 HISTORY — DX: Encounter for screening for cardiovascular disorders: Z13.6

## 2015-04-15 HISTORY — DX: Unilateral inguinal hernia, without obstruction or gangrene, recurrent: K40.91

## 2015-04-15 HISTORY — DX: Hyperlipidemia, unspecified: E78.5

## 2015-04-15 HISTORY — DX: Atrial premature depolarization: I49.1

## 2015-04-15 HISTORY — PX: INSERTION OF MESH: SHX5868

## 2015-04-15 HISTORY — DX: Presence of spectacles and contact lenses: Z97.3

## 2015-04-15 HISTORY — DX: Diverticulosis of large intestine without perforation or abscess without bleeding: K57.30

## 2015-04-15 HISTORY — PX: INGUINAL HERNIA REPAIR: SHX194

## 2015-04-15 HISTORY — DX: Personal history of other malignant neoplasm of skin: Z85.828

## 2015-04-15 HISTORY — DX: Personal history of other diseases of the digestive system: Z87.19

## 2015-04-15 LAB — POCT I-STAT 4, (NA,K, GLUC, HGB,HCT)
GLUCOSE: 98 mg/dL (ref 65–99)
HEMATOCRIT: 48 % (ref 39.0–52.0)
HEMOGLOBIN: 16.3 g/dL (ref 13.0–17.0)
Potassium: 4.4 mmol/L (ref 3.5–5.1)
Sodium: 142 mmol/L (ref 135–145)

## 2015-04-15 SURGERY — REPAIR, HERNIA, INGUINAL, LAPAROSCOPIC
Anesthesia: General | Laterality: Right

## 2015-04-15 MED ORDER — GLYCOPYRROLATE 0.2 MG/ML IJ SOLN
INTRAMUSCULAR | Status: AC
  Filled 2015-04-15: qty 3

## 2015-04-15 MED ORDER — PHENYLEPHRINE HCL 10 MG/ML IJ SOLN
INTRAMUSCULAR | Status: DC | PRN
  Administered 2015-04-15: 120 ug via INTRAVENOUS

## 2015-04-15 MED ORDER — SODIUM CHLORIDE 0.9 % IV SOLN
INTRAVENOUS | Status: DC | PRN
  Administered 2015-04-15: 23 mL

## 2015-04-15 MED ORDER — LACTATED RINGERS IV SOLN
INTRAVENOUS | Status: DC
  Administered 2015-04-15: 10:00:00 via INTRAVENOUS
  Filled 2015-04-15: qty 1000

## 2015-04-15 MED ORDER — LIDOCAINE HCL (CARDIAC) 20 MG/ML IV SOLN
INTRAVENOUS | Status: DC | PRN
  Administered 2015-04-15: 100 mg via INTRAVENOUS

## 2015-04-15 MED ORDER — PROPOFOL 10 MG/ML IV BOLUS
INTRAVENOUS | Status: AC
  Filled 2015-04-15: qty 20

## 2015-04-15 MED ORDER — MEPERIDINE HCL 25 MG/ML IJ SOLN
6.2500 mg | INTRAMUSCULAR | Status: DC | PRN
  Filled 2015-04-15: qty 1

## 2015-04-15 MED ORDER — ROCURONIUM BROMIDE 100 MG/10ML IV SOLN
INTRAVENOUS | Status: DC | PRN
  Administered 2015-04-15 (×2): 10 mg via INTRAVENOUS
  Administered 2015-04-15: 5 mg via INTRAVENOUS
  Administered 2015-04-15: 35 mg via INTRAVENOUS

## 2015-04-15 MED ORDER — ONDANSETRON HCL 4 MG/2ML IJ SOLN
INTRAMUSCULAR | Status: DC | PRN
  Administered 2015-04-15: 4 mg via INTRAVENOUS

## 2015-04-15 MED ORDER — LACTATED RINGERS IV SOLN
INTRAVENOUS | Status: DC
  Administered 2015-04-15 (×2): via INTRAVENOUS
  Filled 2015-04-15: qty 1000

## 2015-04-15 MED ORDER — FENTANYL CITRATE (PF) 250 MCG/5ML IJ SOLN
INTRAMUSCULAR | Status: AC
Start: 2015-04-15 — End: 2015-04-15
  Filled 2015-04-15: qty 5

## 2015-04-15 MED ORDER — FENTANYL CITRATE (PF) 100 MCG/2ML IJ SOLN
INTRAMUSCULAR | Status: AC
  Filled 2015-04-15: qty 2

## 2015-04-15 MED ORDER — NEOSTIGMINE METHYLSULFATE 10 MG/10ML IV SOLN
INTRAVENOUS | Status: DC | PRN
  Administered 2015-04-15: 3 mg via INTRAVENOUS

## 2015-04-15 MED ORDER — MIDAZOLAM HCL 2 MG/2ML IJ SOLN
INTRAMUSCULAR | Status: AC
  Filled 2015-04-15: qty 2

## 2015-04-15 MED ORDER — PROPOFOL 10 MG/ML IV BOLUS
INTRAVENOUS | Status: DC | PRN
  Administered 2015-04-15: 180 mg via INTRAVENOUS

## 2015-04-15 MED ORDER — OXYCODONE-ACETAMINOPHEN 5-325 MG PO TABS: 1.0000 | ORAL_TABLET | ORAL | Status: DC | PRN

## 2015-04-15 MED ORDER — FENTANYL CITRATE (PF) 100 MCG/2ML IJ SOLN
INTRAMUSCULAR | Status: DC | PRN
  Administered 2015-04-15: 50 ug via INTRAVENOUS
  Administered 2015-04-15 (×2): 25 ug via INTRAVENOUS
  Administered 2015-04-15: 100 ug via INTRAVENOUS
  Administered 2015-04-15: 50 ug via INTRAVENOUS

## 2015-04-15 MED ORDER — OXYCODONE-ACETAMINOPHEN 5-325 MG PO TABS
1.0000 | ORAL_TABLET | Freq: Once | ORAL | Status: AC
  Administered 2015-04-15: 1 via ORAL
  Filled 2015-04-15: qty 1

## 2015-04-15 MED ORDER — DEXAMETHASONE SODIUM PHOSPHATE 4 MG/ML IJ SOLN
INTRAMUSCULAR | Status: DC | PRN
  Administered 2015-04-15: 10 mg via INTRAVENOUS

## 2015-04-15 MED ORDER — LIDOCAINE HCL (CARDIAC) 20 MG/ML IV SOLN
INTRAVENOUS | Status: AC
  Filled 2015-04-15: qty 5

## 2015-04-15 MED ORDER — ONDANSETRON HCL 4 MG/2ML IJ SOLN
INTRAMUSCULAR | Status: AC
  Filled 2015-04-15: qty 2

## 2015-04-15 MED ORDER — CEFAZOLIN SODIUM-DEXTROSE 2-3 GM-% IV SOLR
INTRAVENOUS | Status: AC
  Filled 2015-04-15: qty 50

## 2015-04-15 MED ORDER — MIDAZOLAM HCL 5 MG/5ML IJ SOLN
INTRAMUSCULAR | Status: DC | PRN
  Administered 2015-04-15: 2 mg via INTRAVENOUS

## 2015-04-15 MED ORDER — PROMETHAZINE HCL 25 MG/ML IJ SOLN
6.2500 mg | INTRAMUSCULAR | Status: DC | PRN
  Filled 2015-04-15: qty 1

## 2015-04-15 MED ORDER — FENTANYL CITRATE (PF) 100 MCG/2ML IJ SOLN
25.0000 ug | INTRAMUSCULAR | Status: DC | PRN
  Administered 2015-04-15 (×2): 50 ug via INTRAVENOUS
  Filled 2015-04-15: qty 1

## 2015-04-15 MED ORDER — ARTIFICIAL TEARS OP OINT
TOPICAL_OINTMENT | OPHTHALMIC | Status: AC
  Filled 2015-04-15: qty 3.5

## 2015-04-15 MED ORDER — CHLORHEXIDINE GLUCONATE 4 % EX LIQD
1.0000 "application " | Freq: Once | CUTANEOUS | Status: DC
  Filled 2015-04-15: qty 15

## 2015-04-15 MED ORDER — PROPOFOL 10 MG/ML IV BOLUS
INTRAVENOUS | Status: AC
Start: 2015-04-15 — End: 2015-04-15
  Filled 2015-04-15: qty 20

## 2015-04-15 MED ORDER — SODIUM CHLORIDE 0.9 % IR SOLN
Status: DC | PRN
  Administered 2015-04-15: 1000 mL
  Administered 2015-04-15: 20 mL

## 2015-04-15 MED ORDER — OXYCODONE-ACETAMINOPHEN 5-325 MG PO TABS
ORAL_TABLET | ORAL | Status: AC
  Filled 2015-04-15: qty 1

## 2015-04-15 MED ORDER — DEXTROSE 5 % IV SOLN
2.0000 g | INTRAVENOUS | Status: AC
  Administered 2015-04-15: 2 g via INTRAVENOUS
  Filled 2015-04-15: qty 20

## 2015-04-15 MED ORDER — ROCURONIUM BROMIDE 100 MG/10ML IV SOLN
INTRAVENOUS | Status: AC
  Filled 2015-04-15: qty 2

## 2015-04-15 MED ORDER — LIDOCAINE HCL (CARDIAC) 20 MG/ML IV SOLN
INTRAVENOUS | Status: AC
Start: 2015-04-15 — End: 2015-04-15
  Filled 2015-04-15: qty 5

## 2015-04-15 MED ORDER — GLYCOPYRROLATE 0.2 MG/ML IJ SOLN
INTRAMUSCULAR | Status: DC | PRN
  Administered 2015-04-15: .4 mg via INTRAVENOUS

## 2015-04-15 MED ORDER — DEXAMETHASONE SODIUM PHOSPHATE 10 MG/ML IJ SOLN
INTRAMUSCULAR | Status: AC
Start: 2015-04-15 — End: 2015-04-15
  Filled 2015-04-15: qty 1

## 2015-04-15 SURGICAL SUPPLY — 39 items
BLADE 11 SAFETY STRL DISP (BLADE) ×2 IMPLANT
BLADE CLIPPER SENSICLIP SURGIC (BLADE) ×2 IMPLANT
CHLORAPREP W/TINT 26ML (MISCELLANEOUS) ×2 IMPLANT
COVER BACK TABLE 60X90IN (DRAPES) ×2 IMPLANT
COVER MAYO STAND STRL (DRAPES) ×2 IMPLANT
DECANTER SPIKE VIAL GLASS SM (MISCELLANEOUS) ×2 IMPLANT
DEVICE SECURE STRAP 25 ABSORB (INSTRUMENTS) ×4 IMPLANT
DISSECT BALLN SPACEMKR + OVL (BALLOONS) ×2
DISSECTOR BALLN SPACEMKR + OVL (BALLOONS) ×1 IMPLANT
DRAPE LAPAROSCOPIC ABDOMINAL (DRAPES) ×2 IMPLANT
DRAPE UTILITY 15X26 TOWEL STRL (DRAPES) ×2 IMPLANT
ELECT REM PT RETURN 9FT ADLT (ELECTROSURGICAL) ×2
ELECTRODE REM PT RTRN 9FT ADLT (ELECTROSURGICAL) ×1 IMPLANT
GLOVE BIOGEL PI IND STRL 7.0 (GLOVE) ×1 IMPLANT
GLOVE BIOGEL PI INDICATOR 7.0 (GLOVE) ×1
GLOVE SURG SS PI 7.0 STRL IVOR (GLOVE) ×2 IMPLANT
GOWN STRL REUS W/TWL LRG LVL3 (GOWN DISPOSABLE) ×2 IMPLANT
GOWN STRL REUS W/TWL XL LVL3 (GOWN DISPOSABLE) ×4 IMPLANT
IV NS 1000ML (IV SOLUTION) ×1
IV NS 1000ML BAXH (IV SOLUTION) ×1 IMPLANT
LIQUID BAND (GAUZE/BANDAGES/DRESSINGS) ×2 IMPLANT
MESH 3DMAX LIGHT 4.8X6.7 RT XL (Mesh General) ×2 IMPLANT
NEEDLE HYPO 25X1 1.5 SAFETY (NEEDLE) ×2 IMPLANT
NEEDLE INSUFFLATION 120MM (ENDOMECHANICALS) ×2 IMPLANT
PACK BASIN DAY SURGERY FS (CUSTOM PROCEDURE TRAY) ×2 IMPLANT
PADDING ION DISPOSABLE (MISCELLANEOUS) ×2 IMPLANT
SCISSORS LAP 5X35 DISP (ENDOMECHANICALS) IMPLANT
SET IRRIG TUBING LAPAROSCOPIC (IRRIGATION / IRRIGATOR) ×2 IMPLANT
SHEARS HARMONIC ACE PLUS 36CM (ENDOMECHANICALS) IMPLANT
SOLUTION ANTI FOG 6CC (MISCELLANEOUS) ×2 IMPLANT
SUT MNCRL AB 4-0 PS2 18 (SUTURE) ×2 IMPLANT
SUT VIC AB 2-0 SH 27 (SUTURE)
SUT VIC AB 2-0 SH 27X BRD (SUTURE) IMPLANT
SUT VICRYL 0 UR6 27IN ABS (SUTURE) ×2 IMPLANT
SYR CONTROL 10ML LL (SYRINGE) ×2 IMPLANT
SYSTEM CARTER THOMASON II (TROCAR) IMPLANT
TOWEL OR 17X24 6PK STRL BLUE (TOWEL DISPOSABLE) ×4 IMPLANT
TROCAR CANNULA W/PORT DUAL 5MM (MISCELLANEOUS) ×2 IMPLANT
TUBING INSUF HEATED (TUBING) ×2 IMPLANT

## 2015-04-15 NOTE — Op Note (Signed)
Preop diagnosis: right inguinal hernia  Postop diagnosis: right direct and indirect inguinal hernia  Procedure: laparoscopic Right inguinal hernia repair with mesh (TEP)  Surgeon: Gurney Maxin, M.D.  Asst: none  Anesthesia: Gen.   Indications for procedure: Daniel Allen is a 55 y.o. male with symptoms of pain and enlarging Right inguinal hernia(s). After discussing risks, alternatives and benefits he decided on laparoscopic repair and was brought to day surgery for repair.  Description of procedure: The patient was brought into the operative suite, placed supine. Anesthesia was administered with endotracheal tube. Patient was strapped in place. Both arms were tucked. All pressure points were offloaded by foam padding. The patient was prepped and draped in the usual sterile fashion.  Lateral incision was made to the Left of the umbilicus. The subcu was bluntly dissected down to the anterior resection fascia which was sharply incised. The rectus muscle was mobilized laterally and a 61m balloon trocar was inserted without resistance. The laparoscope was inserted and the balloon was inflated to separate the planes. Next the balloon was deflated and removed. CO2 was applied. 2 575mtrocars were placed, in the inferior midline. All trocars sites were first anesthesized with 1:1 Exparel: Saline. Next the patient was placed in trendelenberg.   Blunt dissection began moving laterally to identify the ASIS and then moved medially to identify the inferior epigastric vessels and indirect hernia space. There was an obvious hernia sac in the space. The sac was dissected away from the vessels and completely reduced. Next the medial space was dissected to identify the pubic bone and cooper's ligament.  There was one vessel running with the cord that bled during the dissection, a suction/irrigastor was used to further dissect and clean the area and then cautery was used to stop the bleeding after ensuring it  was isolated away from the vas deferens. The sac was large and during the dissection tore allowing some gas to escape into the intraperitoneal area. This was repaired with multiple secure strap tacks.  A 3D max light weight mesh was inserted and tacked laterally just above the ASIS and then medially to the lacunar ligament. Multiple other tacks were used against the abdominal wall to keep the mesh in place. The mesh was positioned flat and directly up against the direct and indirect areas. The hernia sac was tacked to the back of the mesh in the supero lateral area.  The CO2 was evacuated while watching to ensure the mesh did not migrate. The remainder of the Exparel mix was infused 2cm medial and 2cm inferior to the ASIS deep to the fascia. The anterior rectus fascia was closed with 0 vicryl in interrupted sutures and all skin incisions were closed with 4-0 monocryl subcu stitch. The patient awaoke from anesthesia and was brought to PACU in stable condition.  Findings: right direct and indirect inguinal hernia  Specimen: none  Blood loss: <50 ml  Local anesthesia: 23 ml 1:1 Exparel:Saline  Complications: none  Implant: extra large right 3D max light mesh  LuGurney MaxinM.D. General, Bariatric, & Minimally Invasive Surgery [facility]Sum[facility], [place]2:34 PM 04/15/2015

## 2015-04-15 NOTE — Transfer of Care (Signed)
Immediate Anesthesia Transfer of Care Note  Patient: Daniel Allen  Procedure(s) Performed: Procedure(s): LAPAROSCOPIC RIGHT  INGUINAL HERNIA (Right) INSERTION OF MESH (Right)  Patient Location: PACU  Anesthesia Type:General  Level of Consciousness: awake, alert , oriented and patient cooperative  Airway & Oxygen Therapy: Patient Spontanous Breathing and Patient connected to nasal cannula oxygen  Post-op Assessment: Report given to RN and Post -op Vital signs reviewed and stable  Post vital signs: Reviewed and stable  Last Vitals:  Filed Vitals:   04/15/15 1008  BP: 122/80  Pulse: 69  Temp: 36.9 C  Resp: 18    Complications: No apparent anesthesia complications

## 2015-04-15 NOTE — Discharge Instructions (Signed)
°  Post Anesthesia Home Care Instructions  Activity: Get plenty of rest for the remainder of the day. A responsible adult should stay with you for 24 hours following the procedure.  For the next 24 hours, DO NOT: -Drive a car -Paediatric nurse -Drink alcoholic beverages -Take any medication unless instructed by your physician -Make any legal decisions or sign important papers.  Meals: Start with liquid foods such as gelatin or soup. Progress to regular foods as tolerated. Avoid greasy, spicy, heavy foods. If nausea and/or vomiting occur, drink only clear liquids until the nausea and/or vomiting subsides. Call your physician if vomiting continues.  Special Instructions/Symptoms: Your throat may feel dry or sore from the anesthesia or the breathing tube placed in your throat during surgery. If this causes discomfort, gargle with warm salt water. The discomfort should disappear within 24 hours.  If you had a scopolamine patch placed behind your ear for the management of post- operative nausea and/or vomiting:  1. The medication in the patch is effective for 72 hours, after which it should be removed.  Wrap patch in a tissue and discard in the trash. Wash hands thoroughly with soap and water. 2. You may remove the patch earlier than 72 hours if you experience unpleasant side effects which may include dry mouth, dizziness or visual disturbances. 3. Avoid touching the patch. Wash your hands with soap and water after contact with patch. Information for Discharge Teaching: EXPAREL (bupivacaine liposome injectable suspension)   Your surgeon gave you EXPAREL(bupivacaine) in your surgical incision to help control your pain after surgery.   EXPAREL is a local anesthetic that provides pain relief by numbing the tissue around the surgical site.  EXPAREL is designed to release pain medication over time and can control pain for up to 72 hours.  Depending on how you respond to EXPAREL, you may require  less pain medication during your recovery.  Possible side effects:  Temporary loss of sensation or ability to move in the area where bupivacaine was injected.  Nausea, vomiting, constipation  Rarely, numbness and tingling in your mouth or lips, lightheadedness, or anxiety may occur.  Call your doctor right away if you think you may be experiencing any of these sensations, or if you have other questions regarding possible side effects.  Follow all other discharge instructions given to you by your surgeon or nurse. Eat a healthy diet and drink plenty of water or other fluids.  If you return to the hospital for any reason within 96 hours following the administration of EXPAREL, please inform your health care providers.

## 2015-04-15 NOTE — Anesthesia Postprocedure Evaluation (Signed)
Anesthesia Post Note  Patient: Daniel Allen  Procedure(s) Performed: Procedure(s) (LRB): LAPAROSCOPIC RIGHT  INGUINAL HERNIA (Right) INSERTION OF MESH (Right)  Patient location during evaluation: PACU Anesthesia Type: General Level of consciousness: awake and alert Pain management: pain level controlled Vital Signs Assessment: post-procedure vital signs reviewed and stable Respiratory status: spontaneous breathing, nonlabored ventilation, respiratory function stable and patient connected to nasal cannula oxygen Cardiovascular status: blood pressure returned to baseline and stable Postop Assessment: no signs of nausea or vomiting Anesthetic complications: no    Last Vitals:  Filed Vitals:   04/15/15 1008  BP: 122/80  Pulse: 69  Temp: 36.9 C  Resp: 18    Last Pain: There were no vitals filed for this visit.               Alexis Frock

## 2015-04-15 NOTE — Anesthesia Procedure Notes (Signed)
Procedure Name: Intubation Date/Time: 04/15/2015 11:10 AM Performed by: Wanita Chamberlain Pre-anesthesia Checklist: Patient identified, Timeout performed, Emergency Drugs available, Suction available and Patient being monitored Patient Re-evaluated:Patient Re-evaluated prior to inductionOxygen Delivery Method: Circle system utilized Preoxygenation: Pre-oxygenation with 100% oxygen Intubation Type: IV induction Ventilation: Mask ventilation without difficulty and Oral airway inserted - appropriate to patient size Laryngoscope Size: Mac and 4 Grade View: Grade I Tube type: Oral Tube size: 8.0 mm Number of attempts: 1 Placement Confirmation: positive ETCO2 and breath sounds checked- equal and bilateral Secured at: 22 cm Tube secured with: Tape Dental Injury: Teeth and Oropharynx as per pre-operative assessment

## 2015-04-15 NOTE — H&P (Signed)
History of Present Illness The patient is a 55 year old male who presents with an inguinal hernia. The hernia(s) is/are located on the right side. The last clinic visit was 2 year(s) ago. No changes in management were made at the last visit. Symptoms include inguinal bulge and inguinal pain. There is no radiation. The patient describes the pain as sharp. Onset was sudden. Onset followed lifting. The episodes occur 2 times a week and last for 1 hour. The patient describes this as mild and unchanged. He has no nausea or vomiting. He saw a Psychologist, sport and exercise 2 years ago and decided not to proceed with surgery at the time. The hernia will reduce with relaxation he does not require manual palpation to reduce.  Referred by Dr. Ronnald Ramp   Other Problems  Crohn's Disease Inguinal Hernia Ulcerative Colitis  Past Surgical History Hemorrhoidectomy Laparoscopic Inguinal Hernia Surgery Right.  Diagnostic Studies History Colonoscopy 1-5 years ago  Allergies No Known Drug Allergies11/17/2016  Medication History  Atorvastatin Calcium (10MG Tablet, Oral) Active. Aspirin (81MG Tablet, Oral) Active. Medications Reconciled  Social History Alcohol use Occasional alcohol use. Caffeine use Coffee, Tea. No drug use Tobacco use Never smoker.  Family History  Cancer Father. Heart Disease Father. Heart disease in male family member before age 79    Review of Systems  General Not Present- Appetite Loss, Chills, Fatigue, Fever, Night Sweats, Weight Gain and Weight Loss. Skin Not Present- Change in Wart/Mole, Dryness, Hives, Jaundice, New Lesions, Non-Healing Wounds, Rash and Ulcer. HEENT Present- Wears glasses/contact lenses. Not Present- Earache, Hearing Loss, Hoarseness, Nose Bleed, Oral Ulcers, Ringing in the Ears, Seasonal Allergies, Sinus Pain, Sore Throat, Visual Disturbances and Yellow Eyes. Respiratory Not Present- Bloody sputum, Chronic Cough, Difficulty Breathing, Snoring and  Wheezing. Breast Not Present- Breast Mass, Breast Pain, Nipple Discharge and Skin Changes. Cardiovascular Not Present- Chest Pain, Difficulty Breathing Lying Down, Leg Cramps, Palpitations, Rapid Heart Rate, Shortness of Breath and Swelling of Extremities. Gastrointestinal Present- Abdominal Pain. Not Present- Bloating, Bloody Stool, Change in Bowel Habits, Chronic diarrhea, Constipation, Difficulty Swallowing, Excessive gas, Gets full quickly at meals, Hemorrhoids, Indigestion, Nausea, Rectal Pain and Vomiting. Male Genitourinary Not Present- Blood in Urine, Change in Urinary Stream, Frequency, Impotence, Nocturia, Painful Urination, Urgency and Urine Leakage. Musculoskeletal Not Present- Back Pain, Joint Pain, Joint Stiffness, Muscle Pain, Muscle Weakness and Swelling of Extremities. Neurological Not Present- Decreased Memory, Fainting, Headaches, Numbness, Seizures, Tingling, Tremor, Trouble walking and Weakness. Psychiatric Not Present- Anxiety, Bipolar, Change in Sleep Pattern, Depression, Fearful and Frequent crying. Endocrine Not Present- Cold Intolerance, Excessive Hunger, Hair Changes, Heat Intolerance, Hot flashes and New Diabetes. Hematology Not Present- Easy Bruising, Excessive bleeding, Gland problems, HIV and Persistent Infections.  BP 122/80 mmHg  Pulse 69  Temp(Src) 98.4 F (36.9 C) (Oral)  Resp 18  Ht 6' 1"  (1.854 m)  Wt 87.771 kg (193 lb 8 oz)  BMI 25.53 kg/m2  SpO2 99%     Physical Exam General Mental Status-Alert. General Appearance-Cooperative. Orientation-Oriented X4. Build & Nutrition-Obese. Posture-Normal posture.  Integumentary Global Assessment Normal Exam - Head/Face: no rashes, ulcers, lesions or evidence of photo damage. No palpable nodules or masses and Neck: no visible lesions or palpable masses.  Head and Neck Head-normocephalic, atraumatic with no lesions or palpable masses. Face Global Assessment - atraumatic. Thyroid Gland  Characteristics - normal size and consistency.  Eye Eyeball - Bilateral-Extraocular movements intact. Sclera/Conjunctiva - Bilateral-No scleral icterus, No Discharge.  ENMT Nose and Sinuses Nose - no deformities observed, no swelling present.  Chest and Lung Exam Palpation Normal exam - Non-tender. Auscultation Breath sounds - Normal.  Cardiovascular Auscultation Rhythm - Regular. Heart Sounds - S1 WNL and S2 WNL. Carotid arteries - No Carotid bruit.  Abdomen Inspection Normal Exam - No Visible peristalsis, No Abnormal pulsations and No Paradoxical movements. Hernias - Inguinal hernia - Right - Reducible. Palpation/Percussion Normal exam - Soft, Non Tender, No Rebound tenderness, No Rigidity (guarding), No hepatosplenomegaly and No Palpable abdominal masses.  Peripheral Vascular Upper Extremity Palpation - Pulses bilaterally normal. Lower Extremity Palpation - Edema - Bilateral - No edema.  Neurologic Neurologic evaluation reveals -normal sensation and normal coordination.  Neuropsychiatric Mental status exam performed with findings of-able to articulate well with normal speech/language, rate, volume and coherence and thought content normal with ability to perform basic computations and apply abstract reasoning.  Musculoskeletal Normal Exam - Bilateral-Upper Extremity Strength Normal and Lower Extremity Strength Normal.    Assessment & Plan RECURRENT RIGHT INGUINAL HERNIA (K40.91) Impression: 56 yo male with recurrent inguinal hernia. He previously had a open repair with mesh with complete resolution of symptoms. His symptoms are not so severe that he is rushing to surgery, but would like it done. We discussed that since he had an open operation a laparoscopic operation is the best option for repair for him. We discussed risks of intestinal injury, recurrence, infection, urinary retention, chronic pains. He showed good understanding and decided to proceed. -At  this time he will wait and think about it and if the symptoms worsen call to schedule, otherwise his spring is very busy due to his job and he will wait until summer for repair. Current Plans The anatomy & physiology of the abdominal wall and pelvic floor was discussed. The pathophysiology of hernias in the inguinal and pelvic region was discussed. Natural history risks such as progressive enlargement, pain, incarceration, and strangulation was discussed. Contributors to complications such as smoking, obesity, diabetes, prior surgery, etc were discussed.  I feel the risks of no intervention will lead to serious problems that outweigh the operative risks; therefore, I recommended surgery to reduce and repair the hernia. I explained laparoscopic techniques with possible need for an open approach. I noted usual use of mesh to patch and/or buttress hernia repair  Risks such as bleeding, infection, abscess, need for further treatment, heart attack, death, and other risks were discussed. I noted a good likelihood this will help address the problem. Goals of post-operative recovery were discussed as well. Possibility that this will not correct all symptoms was explained. I stressed the importance of low-impact activity, aggressive pain control, avoiding constipation, & not pushing through pain to minimize risk of post-operative chronic pain or injury. Possibility of reherniation was discussed. We will work to minimize complications.  An educational handout further explaining the pathology & treatment options was given as well. Questions were answered. The patient expresses understanding & wishes to proceed with surgery.  You are being scheduled for surgery - Our schedulers will call you.  You should hear from our office's scheduling department within 5 working days about the location, date, and time of surgery. We try to make accommodations for patient's preferences in scheduling surgery, but sometimes the OR  schedule or the surgeon's schedule prevents [place] from making those accommodations.  If you have not heard from our office [phone]) in 5 working days, call the office and ask for your surgeon's nurse.  If you have other questions about your diagnosis, plan, or surgery, call the office and ask for  your surgeon's nurse.  Pt Education - Pamphlet Given - Laparoscopic Hernia Repair: discussed with patient and provided information.

## 2015-04-16 ENCOUNTER — Encounter (HOSPITAL_BASED_OUTPATIENT_CLINIC_OR_DEPARTMENT_OTHER): Payer: Self-pay | Admitting: General Surgery

## 2015-09-30 ENCOUNTER — Other Ambulatory Visit: Payer: Self-pay | Admitting: Internal Medicine

## 2016-01-02 ENCOUNTER — Other Ambulatory Visit: Payer: Self-pay | Admitting: Internal Medicine

## 2016-01-29 ENCOUNTER — Ambulatory Visit: Payer: Self-pay | Admitting: Internal Medicine

## 2016-04-04 ENCOUNTER — Other Ambulatory Visit: Payer: Self-pay | Admitting: Internal Medicine

## 2016-04-15 ENCOUNTER — Encounter: Payer: Self-pay | Admitting: Internal Medicine

## 2016-04-15 ENCOUNTER — Ambulatory Visit (INDEPENDENT_AMBULATORY_CARE_PROVIDER_SITE_OTHER): Payer: BLUE CROSS/BLUE SHIELD | Admitting: Internal Medicine

## 2016-04-15 ENCOUNTER — Other Ambulatory Visit (INDEPENDENT_AMBULATORY_CARE_PROVIDER_SITE_OTHER): Payer: BLUE CROSS/BLUE SHIELD

## 2016-04-15 VITALS — BP 140/86 | HR 62 | Temp 98.4°F | Ht 73.0 in | Wt 193.1 lb

## 2016-04-15 DIAGNOSIS — I491 Atrial premature depolarization: Secondary | ICD-10-CM

## 2016-04-15 DIAGNOSIS — E785 Hyperlipidemia, unspecified: Secondary | ICD-10-CM | POA: Diagnosis not present

## 2016-04-15 DIAGNOSIS — Z0000 Encounter for general adult medical examination without abnormal findings: Secondary | ICD-10-CM

## 2016-04-15 LAB — LIPID PANEL
CHOL/HDL RATIO: 4
CHOLESTEROL: 188 mg/dL (ref 0–200)
HDL: 44.8 mg/dL (ref >39.00)
LDL CALC: 109 mg/dL — AB (ref 0–99)
NonHDL: 142.7
TRIGLYCERIDES: 168 mg/dL — AB (ref 0.0–149.0)
VLDL: 33.6 mg/dL (ref 0.0–40.0)

## 2016-04-15 LAB — COMPREHENSIVE METABOLIC PANEL
ALBUMIN: 4.6 g/dL (ref 3.5–5.2)
ALK PHOS: 64 U/L (ref 39–117)
ALT: 19 U/L (ref 0–53)
AST: 19 U/L (ref 0–37)
BILIRUBIN TOTAL: 0.5 mg/dL (ref 0.2–1.2)
BUN: 13 mg/dL (ref 6–23)
CO2: 31 mEq/L (ref 19–32)
Calcium: 9.5 mg/dL (ref 8.4–10.5)
Chloride: 104 mEq/L (ref 96–112)
Creatinine, Ser: 0.97 mg/dL (ref 0.40–1.50)
GFR: 85.18 mL/min (ref >60.00)
GLUCOSE: 81 mg/dL (ref 70–99)
Potassium: 4.2 mEq/L (ref 3.5–5.1)
Sodium: 140 mEq/L (ref 135–145)
TOTAL PROTEIN: 7 g/dL (ref 6.0–8.3)

## 2016-04-15 LAB — URINALYSIS, ROUTINE W REFLEX MICROSCOPIC
BILIRUBIN URINE: NEGATIVE
HGB URINE DIPSTICK: NEGATIVE
KETONES UR: NEGATIVE
LEUKOCYTES UA: NEGATIVE
Nitrite: NEGATIVE
PH: 7 (ref 5.0–8.0)
RBC / HPF: NONE SEEN (ref 0–?)
Specific Gravity, Urine: 1.015 (ref 1.000–1.030)
TOTAL PROTEIN, URINE-UPE24: NEGATIVE
URINE GLUCOSE: NEGATIVE
UROBILINOGEN UA: 0.2 (ref 0.0–1.0)
WBC, UA: NONE SEEN (ref 0–?)

## 2016-04-15 LAB — CBC WITH DIFFERENTIAL/PLATELET
BASOS ABS: 0.1 10*3/uL (ref 0.0–0.1)
Basophils Relative: 0.8 % (ref 0.0–3.0)
EOS PCT: 2.6 % (ref 0.0–5.0)
Eosinophils Absolute: 0.2 10*3/uL (ref 0.0–0.7)
HCT: 46.6 % (ref 39.0–52.0)
HEMOGLOBIN: 15.6 g/dL (ref 13.0–17.0)
LYMPHS ABS: 1.9 10*3/uL (ref 0.7–4.0)
Lymphocytes Relative: 25.3 % (ref 12.0–46.0)
MCHC: 33.5 g/dL (ref 30.0–36.0)
MCV: 90 fl (ref 78.0–100.0)
MONO ABS: 0.4 10*3/uL (ref 0.1–1.0)
Monocytes Relative: 5.6 % (ref 3.0–12.0)
NEUTROS PCT: 65.7 % (ref 43.0–77.0)
Neutro Abs: 4.8 10*3/uL (ref 1.4–7.7)
Platelets: 243 10*3/uL (ref 150.0–400.0)
RBC: 5.18 Mil/uL (ref 4.22–5.81)
RDW: 13.4 % (ref 11.5–15.5)
WBC: 7.4 10*3/uL (ref 4.0–10.5)

## 2016-04-15 LAB — PSA: PSA: 0.9 ng/mL (ref 0.10–4.00)

## 2016-04-15 LAB — TSH: TSH: 1.71 u[IU]/mL (ref 0.35–4.50)

## 2016-04-15 NOTE — Progress Notes (Signed)
Pre visit review using our clinic review tool, if applicable. No additional management support is needed unless otherwise documented below in the visit note. 

## 2016-04-15 NOTE — Patient Instructions (Signed)
 Health Maintenance, Male A healthy lifestyle and preventive care is important for your health and wellness. Ask your health care provider about what schedule of regular examinations is right for you. What should I know about weight and diet?  Eat a Healthy Diet  Eat plenty of vegetables, fruits, whole grains, low-fat dairy products, and lean protein.  Do not eat a lot of foods high in solid fats, added sugars, or salt. Maintain a Healthy Weight  Regular exercise can help you achieve or maintain a healthy weight. You should:  Do at least 150 minutes of exercise each week. The exercise should increase your heart rate and make you sweat (moderate-intensity exercise).  Do strength-training exercises at least twice a week. Watch Your Levels of Cholesterol and Blood Lipids  Have your blood tested for lipids and cholesterol every 5 years starting at 56 years of age. If you are at high risk for heart disease, you should start having your blood tested when you are 56 years old. You may need to have your cholesterol levels checked more often if:  Your lipid or cholesterol levels are high.  You are older than 56 years of age.  You are at high risk for heart disease. What should I know about cancer screening? Many types of cancers can be detected early and may often be prevented. Lung Cancer  You should be screened every year for lung cancer if:  You are a current smoker who has smoked for at least 30 years.  You are a former smoker who has quit within the past 15 years.  Talk to your health care provider about your screening options, when you should start screening, and how often you should be screened. Colorectal Cancer  Routine colorectal cancer screening usually begins at 56 years of age and should be repeated every 5-10 years until you are 56 years old. You may need to be screened more often if early forms of precancerous polyps or small growths are found. Your health care provider  may recommend screening at an earlier age if you have risk factors for colon cancer.  Your health care provider may recommend using home test kits to check for hidden blood in the stool.  A small camera at the end of a tube can be used to examine your colon (sigmoidoscopy or colonoscopy). This checks for the earliest forms of colorectal cancer. Prostate and Testicular Cancer  Depending on your age and overall health, your health care provider may do certain tests to screen for prostate and testicular cancer.  Talk to your health care provider about any symptoms or concerns you have about testicular or prostate cancer. Skin Cancer  Check your skin from head to toe regularly.  Tell your health care provider about any new moles or changes in moles, especially if:  There is a change in a mole's size, shape, or color.  You have a mole that is larger than a pencil eraser.  Always use sunscreen. Apply sunscreen liberally and repeat throughout the day.  Protect yourself by wearing long sleeves, pants, a wide-brimmed hat, and sunglasses when outside. What should I know about heart disease, diabetes, and high blood pressure?  If you are 18-39 years of age, have your blood pressure checked every 3-5 years. If you are 40 years of age or older, have your blood pressure checked every year. You should have your blood pressure measured twice-once when you are at a hospital or clinic, and once when you are not at   a hospital or clinic. Record the average of the two measurements. To check your blood pressure when you are not at a hospital or clinic, you can use:  An automated blood pressure machine at a pharmacy.  A home blood pressure monitor.  Talk to your health care provider about your target blood pressure.  If you are between 45-79 years old, ask your health care provider if you should take aspirin to prevent heart disease.  Have regular diabetes screenings by checking your fasting blood sugar  level.  If you are at a normal weight and have a low risk for diabetes, have this test once every three years after the age of 45.  If you are overweight and have a high risk for diabetes, consider being tested at a younger age or more often.  A one-time screening for abdominal aortic aneurysm (AAA) by ultrasound is recommended for men aged 65-75 years who are current or former smokers. What should I know about preventing infection? Hepatitis B  If you have a higher risk for hepatitis B, you should be screened for this virus. Talk with your health care provider to find out if you are at risk for hepatitis B infection. Hepatitis C  Blood testing is recommended for:  Everyone born from 1945 through 1965.  Anyone with known risk factors for hepatitis C. Sexually Transmitted Diseases (STDs)  You should be screened each year for STDs including gonorrhea and chlamydia if:  You are sexually active and are younger than 56 years of age.  You are older than 56 years of age and your health care provider tells you that you are at risk for this type of infection.  Your sexual activity has changed since you were last screened and you are at an increased risk for chlamydia or gonorrhea. Ask your health care provider if you are at risk.  Talk with your health care provider about whether you are at high risk of being infected with HIV. Your health care provider may recommend a prescription medicine to help prevent HIV infection. What else can I do?  Schedule regular health, dental, and eye exams.  Stay current with your vaccines (immunizations).  Do not use any tobacco products, such as cigarettes, chewing tobacco, and e-cigarettes. If you need help quitting, ask your health care provider.  Limit alcohol intake to no more than 2 drinks per day. One drink equals 12 ounces of beer, 5 ounces of wine, or 1 ounces of hard liquor.  Do not use street drugs.  Do not share needles.  Ask your health  care provider for help if you need support or information about quitting drugs.  Tell your health care provider if you often feel depressed.  Tell your health care provider if you have ever been abused or do not feel safe at home. This information is not intended to replace advice given to you by your health care provider. Make sure you discuss any questions you have with your health care provider. Document Released: 06/26/2007 Document Revised: 08/27/2015 Document Reviewed: 10/01/2014 [employer] Interactive Patient Education  2017 [employer]  

## 2016-04-15 NOTE — Progress Notes (Signed)
Subjective:  Patient ID: Daniel Allen, male    DOB: 08/26/1960  Age: 56 y.o. MRN: [id number]  CC: Annual Exam   HPI Daniel Allen presents for a CPX.  He ran out of his statin several months ago and has not been taking it. When he did take it he did not experience any muscle or joint aches. He feels well today and offers no complaints.  Outpatient Medications Prior to Visit  Medication Sig Dispense Refill  . aspirin EC 81 MG tablet Take 1 tablet (81 mg total) by mouth daily. 90 tablet 11  . atorvastatin (LIPITOR) 10 MG tablet TAKE 1 TABLET(10 MG) BY MOUTH DAILY 90 tablet 0  . atorvastatin (LIPITOR) 10 MG tablet TAKE 1 TABLET(10 MG) BY MOUTH DAILY (Patient taking differently: Take 10 mg by mouth every morning. TAKE 1 TABLET(10 MG) BY MOUTH DAILY) 90 tablet 3  . oxyCODONE-acetaminophen (ROXICET) 5-325 MG tablet Take 1 tablet by mouth every 4 (four) hours as needed for severe pain. 30 tablet 0   No facility-administered medications prior to visit.     ROS Review of Systems  Constitutional: Negative.  Negative for activity change, appetite change, diaphoresis, fatigue and unexpected weight change.  HENT: Negative.   Eyes: Negative.   Respiratory: Negative for cough, chest tightness, shortness of breath and wheezing.   Cardiovascular: Negative.  Negative for chest pain, palpitations and leg swelling.  Gastrointestinal: Negative for abdominal pain, constipation, diarrhea, nausea and vomiting.  Endocrine: Negative.   Genitourinary: Negative.  Negative for decreased urine volume, difficulty urinating, discharge, penile swelling, scrotal swelling, testicular pain and urgency.  Musculoskeletal: Negative.  Negative for arthralgias, back pain, myalgias and neck pain.  Skin: Negative.   Allergic/Immunologic: Negative.   Neurological: Negative.   Hematological: Negative for adenopathy. Does not bruise/bleed easily.  Psychiatric/Behavioral: Negative.     Objective:  BP 140/86 (BP  Location: Right Arm, Patient Position: Sitting, Cuff Size: Normal)   Pulse 62   Temp 98.4 F (36.9 C) (Oral)   Ht 6' 1"  (1.854 m)   Wt 193 lb 1 oz (87.6 kg)   SpO2 98%   BMI 25.47 kg/m   BP Readings from Last 3 Encounters:  04/15/16 140/86  04/15/15 140/86  03/04/15 126/82    Wt Readings from Last 3 Encounters:  04/15/16 193 lb 1 oz (87.6 kg)  04/15/15 193 lb 8 oz (87.8 kg)  03/04/15 193 lb 6.4 oz (87.7 kg)    Physical Exam  Constitutional: He is oriented to person, place, and time. No distress.  HENT:  Mouth/Throat: Oropharynx is clear and moist. No oropharyngeal exudate.  Eyes: Conjunctivae are normal. Right eye exhibits no discharge. Left eye exhibits no discharge. No scleral icterus.  Neck: Normal range of motion. Neck supple. No JVD present. No tracheal deviation present. No thyromegaly present.  Cardiovascular: Normal rate, regular rhythm, normal heart sounds and intact distal pulses.  Exam reveals no gallop and no friction rub.   No murmur heard. EKG--  Sinus  Bradycardia  WITHIN NORMAL LIMITS  Pulmonary/Chest: Effort normal and breath sounds normal. No stridor. No respiratory distress. He has no wheezes. He has no rales. He exhibits no tenderness.  Abdominal: Soft. Bowel sounds are normal. He exhibits no distension and no mass. There is no tenderness. There is no rebound and no guarding. Hernia confirmed negative in the right inguinal area and confirmed negative in the left inguinal area.  Genitourinary: Testes normal. Right testis shows no mass, no swelling and no  tenderness. Right testis is descended. Left testis shows no mass, no swelling and no tenderness. Left testis is descended. Circumcised. No penile erythema or penile tenderness. No discharge found.  Musculoskeletal: Normal range of motion. He exhibits no edema, tenderness or deformity.  Lymphadenopathy:    He has no cervical adenopathy.       Right: No inguinal adenopathy present.       Left: No inguinal  adenopathy present.  Neurological: He is oriented to person, place, and time.  Skin: Skin is warm and dry. No rash noted. He is not diaphoretic. No erythema. No pallor.  Psychiatric: He has a normal mood and affect. His behavior is normal. Judgment and thought content normal.  Vitals reviewed.   Lab Results  Component Value Date   WBC 7.4 04/15/2016   HGB 15.6 04/15/2016   HCT 46.6 04/15/2016   PLT 243.0 04/15/2016   GLUCOSE 81 04/15/2016   CHOL 188 04/15/2016   TRIG 168.0 (H) 04/15/2016   HDL 44.80 04/15/2016   LDLDIRECT 162.9 09/14/2013   LDLCALC 109 (H) 04/15/2016   ALT 19 04/15/2016   AST 19 04/15/2016   NA 140 04/15/2016   K 4.2 04/15/2016   CL 104 04/15/2016   CREATININE 0.97 04/15/2016   BUN 13 04/15/2016   CO2 31 04/15/2016   TSH 1.71 04/15/2016   PSA 0.90 04/15/2016    No results found.  Assessment & Plan:   Daniel Allen was seen today for annual exam.  Diagnoses and all orders for this visit:  Routine general medical examination at a health care facility- exam completed, labs reviewed, he refused a flu vaccine, colon cancer screening is up-to-date, patient education material was given. -     Lipid panel; Future -     Comprehensive metabolic panel; Future -     CBC with Differential/Platelet; Future -     PSA; Future -     TSH; Future -     Urinalysis, Routine w reflex microscopic; Future  PAC (premature atrial contraction)- he's had no recent palpitations or other cardiovascular symptoms, his EKG shows sinus bradycardia today, if he is having PACs they're not bothering him so no further treatment or intervention is needed at this time. -     EKG 12-Lead  Hyperlipidemia LDL goal <100- his Framingham risk score is mildly elevated at 10% so I have asked him to restart the statin and to take an aspirin a day for cardiovascular risk reduction. -     aspirin EC 81 MG tablet; Take 1 tablet (81 mg total) by mouth daily. -     atorvastatin (LIPITOR) 10 MG tablet; TAKE 1  TABLET(10 MG) BY MOUTH DAILY   I have discontinued Daniel Allen's oxyCODONE-acetaminophen. I am also having him maintain his aspirin EC and atorvastatin.  Meds ordered this encounter  Medications  . aspirin EC 81 MG tablet    Sig: Take 1 tablet (81 mg total) by mouth daily.    Dispense:  90 tablet    Refill:  3  . atorvastatin (LIPITOR) 10 MG tablet    Sig: TAKE 1 TABLET(10 MG) BY MOUTH DAILY    Dispense:  90 tablet    Refill:  3     Follow-up: Return if symptoms worsen or fail to improve.  Scarlette Calico, MD

## 2016-04-16 ENCOUNTER — Encounter: Payer: Self-pay | Admitting: Internal Medicine

## 2016-04-16 MED ORDER — ASPIRIN EC 81 MG PO TBEC: 81.0000 mg | DELAYED_RELEASE_TABLET | Freq: Every day | ORAL | 3 refills | Status: DC

## 2016-04-16 MED ORDER — ATORVASTATIN CALCIUM 10 MG PO TABS: ORAL_TABLET | ORAL | 3 refills | Status: DC

## 2016-06-11 ENCOUNTER — Encounter: Payer: Self-pay | Admitting: Internal Medicine

## 2016-06-11 ENCOUNTER — Ambulatory Visit (INDEPENDENT_AMBULATORY_CARE_PROVIDER_SITE_OTHER): Payer: BLUE CROSS/BLUE SHIELD | Admitting: Internal Medicine

## 2016-06-11 VITALS — BP 142/80 | HR 60 | Temp 98.0°F | Resp 12 | Ht 73.0 in | Wt 191.0 lb

## 2016-06-11 DIAGNOSIS — R109 Unspecified abdominal pain: Secondary | ICD-10-CM

## 2016-06-11 LAB — POC URINALSYSI DIPSTICK (AUTOMATED)
Bilirubin, UA: NEGATIVE
Glucose, UA: NEGATIVE
KETONES UA: NEGATIVE
Leukocytes, UA: NEGATIVE
Nitrite, UA: NEGATIVE
PH UA: 6 (ref 5.0–8.0)
PROTEIN UA: NEGATIVE
RBC UA: NEGATIVE
SPEC GRAV UA: 1.02 (ref 1.010–1.025)
UROBILINOGEN UA: 0.2 U/dL

## 2016-06-11 MED ORDER — TRAMADOL HCL 50 MG PO TABS
50.0000 mg | ORAL_TABLET | Freq: Three times a day (TID) | ORAL | 0 refills | Status: DC | PRN
Start: 2016-06-11 — End: 2017-04-20

## 2016-06-11 NOTE — Progress Notes (Signed)
   Subjective:    Patient ID: Daniel Allen, male    DOB: Dec 18, 1960, 56 y.o.   MRN: [id number]  HPI The patient is a 56 YO man coming in for right flank pain and lower right quadrant pain. Had right inguinal hernia fixed about 1 year ago and this pain felt different from current. Denies burning with urination or blood in urine or nausea or vomiting. No diarrhea or constipation. Denies pain changing with eating or drinking. Denies pain with pushing on the area or twisting. About 5/10 all the time and sometimes gets worse but seems random. No fevers or chills. Denies change in activity or overuse. Started about 4-5 days ago and no progression since onset. Has not tried anything for pain.    Review of Systems  Constitutional: Negative for activity change, appetite change, fatigue, fever and unexpected weight change.  Respiratory: Negative.   Cardiovascular: Negative.   Gastrointestinal: Positive for abdominal pain. Negative for abdominal distention, anal bleeding, blood in stool, constipation, diarrhea, nausea, rectal pain and vomiting.  Genitourinary: Negative.   Musculoskeletal: Positive for back pain and myalgias.  Skin: Negative.   Neurological: Negative.       Objective:   Physical Exam  Constitutional: He is oriented to person, place, and time. He appears well-developed and well-nourished.  HENT:  Head: Normocephalic and atraumatic.  Eyes: EOM are normal.  Neck: Normal range of motion.  Cardiovascular: Normal rate and regular rhythm.   Pulmonary/Chest: Effort normal and breath sounds normal.  Abdominal: Soft. Bowel sounds are normal. He exhibits no distension and no mass. There is tenderness. There is no rebound and no guarding.  Pain on the RLQ and right flank, not worse with palpation  Neurological: He is alert and oriented to person, place, and time.  Skin: Skin is warm and dry.   Vitals:   06/11/16 0913  BP: (!) 142/80  Pulse: 60  Resp: 12  Temp: 98 F (36.7 C)    TempSrc: Oral  SpO2: 100%  Weight: 191 lb (86.6 kg)  Height: 6' 1"  (1.854 m)      Assessment & Plan:

## 2016-06-11 NOTE — Patient Instructions (Addendum)
We did not see any signs of blood in the urine so we will get the CT scan to check for kidney stones.   We have given you the prescription for tramadol which is stronger than tylenol to use for pain if needed. I would recommend to try tylenol or ibuprofen first to see if that helps.

## 2016-06-12 DIAGNOSIS — R109 Unspecified abdominal pain: Secondary | ICD-10-CM | POA: Insufficient documentation

## 2016-06-12 NOTE — Assessment & Plan Note (Signed)
Ordered CT non contrast to rule out kidney stones. U/A in the office without signs of infection. No clinical signs of appendicitis with ongoing for about 5 days and no progression and no rebound on exam. Rx for tramadol for pain control and encouraged to use ibuprofen and tylenol first to help.

## 2016-06-14 ENCOUNTER — Inpatient Hospital Stay: Admission: RE | Admit: 2016-06-14 | Payer: BLUE CROSS/BLUE SHIELD | Source: Ambulatory Visit

## 2016-06-14 ENCOUNTER — Ambulatory Visit (INDEPENDENT_AMBULATORY_CARE_PROVIDER_SITE_OTHER)
Admission: RE | Admit: 2016-06-14 | Discharge: 2016-06-14 | Disposition: A | Discharge status: Home / Self Care | Payer: BLUE CROSS/BLUE SHIELD | Source: Ambulatory Visit | Attending: Internal Medicine | Admitting: Internal Medicine

## 2016-06-14 DIAGNOSIS — R109 Unspecified abdominal pain: Secondary | ICD-10-CM

## 2016-06-15 ENCOUNTER — Ambulatory Visit: Payer: Self-pay | Admitting: Internal Medicine

## 2016-06-24 ENCOUNTER — Encounter: Payer: Self-pay | Admitting: Internal Medicine

## 2016-06-24 ENCOUNTER — Ambulatory Visit (INDEPENDENT_AMBULATORY_CARE_PROVIDER_SITE_OTHER)
Admission: RE | Admit: 2016-06-24 | Discharge: 2016-06-24 | Disposition: A | Discharge status: Home / Self Care | Payer: BLUE CROSS/BLUE SHIELD | Source: Ambulatory Visit | Attending: Internal Medicine | Admitting: Internal Medicine

## 2016-06-24 ENCOUNTER — Ambulatory Visit (INDEPENDENT_AMBULATORY_CARE_PROVIDER_SITE_OTHER): Payer: BLUE CROSS/BLUE SHIELD | Admitting: Internal Medicine

## 2016-06-24 VITALS — BP 138/90 | HR 62 | Temp 98.2°F | Resp 12 | Ht 73.0 in | Wt 191.0 lb

## 2016-06-24 DIAGNOSIS — M5441 Lumbago with sciatica, right side: Secondary | ICD-10-CM

## 2016-06-24 DIAGNOSIS — K50918 Crohn's disease, unspecified, with other complication: Secondary | ICD-10-CM | POA: Diagnosis not present

## 2016-06-24 DIAGNOSIS — M47816 Spondylosis without myelopathy or radiculopathy, lumbar region: Secondary | ICD-10-CM | POA: Diagnosis not present

## 2016-06-24 MED ORDER — CELECOXIB 200 MG PO CAPS: 200.0000 mg | ORAL_CAPSULE | Freq: Every day | ORAL | 1 refills | Status: DC

## 2016-06-24 NOTE — Progress Notes (Signed)
Subjective:  Patient ID: Daniel Allen, male    DOB: 09-Mar-1960  Age: 56 y.o. MRN: [id number]  CC: Back Pain   HPI Daniel Allen presents for Concerns about right lower back pain that he describes as a persistent aching sensation. He isolates the pain to his right posterior superior iliac crest. 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 The pain does not radiate. He was previously seen and thought to have a kidney stone but a renal CT was negative for stone. He has tried a couple doses of tramadol without much relief from his symptoms. The pain does not radiate and he denies numbness, weakness, tingling in his lower extremities.  Outpatient Medications Prior to Visit  Medication Sig Dispense Refill  . aspirin EC 81 MG tablet Take 1 tablet (81 mg total) by mouth daily. 90 tablet 3  . atorvastatin (LIPITOR) 10 MG tablet TAKE 1 TABLET(10 MG) BY MOUTH DAILY 90 tablet 3  . traMADol (ULTRAM) 50 MG tablet Take 1 tablet (50 mg total) by mouth every 8 (eight) hours as needed. 30 tablet 0   No facility-administered medications prior to visit.     ROS Review of Systems  Constitutional: Negative.  Negative for diaphoresis, fatigue and unexpected weight change.  HENT: Negative.  Negative for trouble swallowing.   Eyes: Negative.   Respiratory: Negative.  Negative for cough, chest tightness and shortness of breath.   Cardiovascular: Negative.  Negative for chest pain, palpitations and leg swelling.  Gastrointestinal: Negative for abdominal pain, constipation, diarrhea, nausea and vomiting.  Endocrine: Negative.   Genitourinary: Negative.  Negative for decreased urine volume, difficulty urinating, dysuria, flank pain, frequency, hematuria and urgency.  Musculoskeletal: Positive for back pain. Negative for joint swelling, myalgias and neck pain.  Skin: Negative.  Negative for color change and rash.  Neurological: Negative.  Negative for weakness  and numbness.  Hematological: Negative.  Negative for adenopathy. Does not bruise/bleed easily.  Psychiatric/Behavioral: Negative.     Objective:  BP 138/90 (BP Location: Left Arm, Patient Position: Sitting, Cuff Size: Normal)   Pulse 62   Temp 98.2 F (36.8 C) (Oral)   Resp 12   Ht 6' 1"  (1.854 m)   Wt 191 lb (86.6 kg)   SpO2 99%   BMI 25.20 kg/m   BP Readings from Last 3 Encounters:  06/24/16 138/90  06/11/16 (!) 142/80  04/15/16 140/86    Wt Readings from Last 3 Encounters:  06/24/16 191 lb (86.6 kg)  06/11/16 191 lb (86.6 kg)  04/15/16 193 lb 1 oz (87.6 kg)    Physical Exam  Constitutional: He is oriented to person, place, and time. No distress.  HENT:  Mouth/Throat: Oropharynx is clear and moist. No oropharyngeal exudate.  Eyes: Conjunctivae are normal. Right eye exhibits no discharge. Left eye exhibits no discharge. No scleral icterus.  Neck: Normal range of motion. Neck supple. No JVD present. No thyromegaly present.  Cardiovascular: Normal rate and regular rhythm.  Exam reveals no gallop and no friction rub.   No murmur heard. Pulmonary/Chest: Effort normal and breath sounds normal. No respiratory distress. He has no wheezes. He has no rales. He exhibits no tenderness.  Abdominal: Soft. Bowel sounds are normal. He exhibits no distension and no mass. There is no tenderness. There is no rebound and no guarding.  Musculoskeletal: Normal range of motion. He exhibits no edema, tenderness or deformity.  Lumbar back: Normal. He exhibits normal range of motion, no tenderness, no bony tenderness, no swelling, no edema, no deformity and no pain.  Lymphadenopathy:    He has no cervical adenopathy.  Neurological: He is oriented to person, place, and time. He has normal reflexes. He displays no atrophy, no tremor and normal reflexes. No cranial nerve deficit or sensory deficit. He exhibits normal muscle tone. He displays a negative Romberg sign. He displays no seizure  activity. Coordination and gait normal. He displays no Babinski's sign on the right side. He displays no Babinski's sign on the left side.  Neg SLR in BLE  Skin: Skin is warm and dry. No rash noted. He is not diaphoretic. No erythema. No pallor.  Vitals reviewed.   Lab Results  Component Value Date   WBC 7.4 04/15/2016   HGB 15.6 04/15/2016   HCT 46.6 04/15/2016   PLT 243.0 04/15/2016   GLUCOSE 81 04/15/2016   CHOL 188 04/15/2016   TRIG 168.0 (H) 04/15/2016   HDL 44.80 04/15/2016   LDLDIRECT 162.9 09/14/2013   LDLCALC 109 (H) 04/15/2016   ALT 19 04/15/2016   AST 19 04/15/2016   NA 140 04/15/2016   K 4.2 04/15/2016   CL 104 04/15/2016   CREATININE 0.97 04/15/2016   BUN 13 04/15/2016   CO2 31 04/15/2016   TSH 1.71 04/15/2016   PSA 0.90 04/15/2016    Ct Renal Stone Study  Result Date: 06/14/2016 CLINICAL DATA:  Right flank pain radiating to the groin for several days. Right inguinal hernia repair last year. EXAM: CT ABDOMEN AND PELVIS WITHOUT CONTRAST TECHNIQUE: Multidetector CT imaging of the abdomen and pelvis was performed following the standard protocol without IV contrast. COMPARISON:  None. FINDINGS: Lower chest: No acute finding. Coronary atherosclerotic calcification, multifocal along the RCA. Hepatobiliary: No focal liver abnormality.No evidence of biliary obstruction or stone. Pancreas: Unremarkable. Spleen: Unremarkable. Adrenals/Urinary Tract: Negative adrenals. No hydronephrosis or stone. Subcentimeter low-density from the upper pole left kidney is too small for densitometry. Unremarkable bladder. Stomach/Bowel: No obstruction. No appendicitis. Prominent sigmoid diverticulosis. Formed stool mainly in the proximal colon and rectum. Vascular/Lymphatic: No acute vascular abnormality. Atherosclerotic calcification of the aorta. No mass or adenopathy. Reproductive:No pathologic findings. Other: No ascites or pneumoperitoneum. History of inguinal hernia repair. No evidence of  recurrence. Musculoskeletal: No acute abnormalities.  Lumbar facet arthropathy. IMPRESSION: 1. No acute finding.  No hydronephrosis or urolithiasis. 2. Colonic diverticulosis, predominately sigmoid. 3.  Aortic Atherosclerosis (ICD10-I70.0).  Coronary atherosclerosis. Electronically Signed   By: Monte Fantasia M.D.   On: 06/14/2016 11:15   Dg Lumbar Spine Complete  Result Date: 06/24/2016 CLINICAL DATA:  Right posterior iliac crest pain for the past 2 weeks. EXAM: LUMBAR SPINE - COMPLETE 4+ VIEW COMPARISON:  Abdomen and pelvis CT dated 06/14/2016. FINDINGS: Five non-rib-bearing lumbar vertebrae. Facet degenerative changes at multiple levels. Mild anterior spur formation at the L3-4 and L4-5 levels. No fractures, pars defects or subluxations. Minimal atheromatous arterial calcifications. IMPRESSION: No acute abnormality.  Multilevel degenerative changes. Electronically Signed   By: Claudie Revering M.D.   On: 06/24/2016 13:30   Ct Renal Stone Study  Result Date: 06/14/2016 CLINICAL DATA:  Right flank pain radiating to the groin for several days. Right inguinal hernia repair last year. EXAM: CT ABDOMEN AND PELVIS WITHOUT CONTRAST TECHNIQUE: Multidetector CT imaging of the abdomen and pelvis was performed following the standard protocol without IV contrast. COMPARISON:  None. FINDINGS: Lower chest: No acute finding. Coronary atherosclerotic calcification, multifocal along  the RCA. Hepatobiliary: No focal liver abnormality.No evidence of biliary obstruction or stone. Pancreas: Unremarkable. Spleen: Unremarkable. Adrenals/Urinary Tract: Negative adrenals. No hydronephrosis or stone. Subcentimeter low-density from the upper pole left kidney is too small for densitometry. Unremarkable bladder. Stomach/Bowel: No obstruction. No appendicitis. Prominent sigmoid diverticulosis. Formed stool mainly in the proximal colon and rectum. Vascular/Lymphatic: No acute vascular abnormality. Atherosclerotic calcification of the aorta.  No mass or adenopathy. Reproductive:No pathologic findings. Other: No ascites or pneumoperitoneum. History of inguinal hernia repair. No evidence of recurrence. Musculoskeletal: No acute abnormalities.  Lumbar facet arthropathy. IMPRESSION: 1. No acute finding.  No hydronephrosis or urolithiasis. 2. Colonic diverticulosis, predominately sigmoid. 3.  Aortic Atherosclerosis (ICD10-I70.0).  Coronary atherosclerosis. Electronically Signed   By: Monte Fantasia M.D.   On: 06/14/2016 11:15    Assessment & Plan:   Ammar was seen today for back pain.  Diagnoses and all orders for this visit:  Crohn's disease with other complication, unspecified gastrointestinal tract location [facility])- he has had no symptoms recently but tells me that his prior gastroenterologist has retired and he wants me to refer him to a new gastroenterologist -     Ambulatory referral to Gastroenterology  Acute right-sided low back pain with right-sided sciatica- plain films are remarkable for mild DDD and a bone spur, his exam is negative for any evidence of radiculopathy, cyst consistent with musculoskeletal back pain, he can continue tramadol if it helps him but will also add celecoxib for additional symptom relief. -     DG Lumbar Spine Complete; Future -     celecoxib (CELEBREX) 200 MG capsule; Take 1 capsule (200 mg total) by mouth daily.   I am having Daniel Allen start on celecoxib. I am also having him maintain his aspirin EC, atorvastatin, and traMADol.  Meds ordered this encounter  Medications  . celecoxib (CELEBREX) 200 MG capsule    Sig: Take 1 capsule (200 mg total) by mouth daily.    Dispense:  90 capsule    Refill:  1     Follow-up: Return in about 4 weeks (around 07/22/2016).  Scarlette Calico, MD

## 2016-06-24 NOTE — Patient Instructions (Signed)

## 2016-08-12 DIAGNOSIS — H524 Presbyopia: Secondary | ICD-10-CM | POA: Diagnosis not present

## 2016-08-12 DIAGNOSIS — H52203 Unspecified astigmatism, bilateral: Secondary | ICD-10-CM | POA: Diagnosis not present

## 2016-08-12 DIAGNOSIS — H5203 Hypermetropia, bilateral: Secondary | ICD-10-CM | POA: Diagnosis not present

## 2016-08-26 ENCOUNTER — Encounter: Payer: Self-pay | Admitting: Internal Medicine

## 2016-10-21 DIAGNOSIS — Z85828 Personal history of other malignant neoplasm of skin: Secondary | ICD-10-CM | POA: Diagnosis not present

## 2016-10-21 DIAGNOSIS — D2262 Melanocytic nevi of left upper limb, including shoulder: Secondary | ICD-10-CM | POA: Diagnosis not present

## 2016-10-21 DIAGNOSIS — D225 Melanocytic nevi of trunk: Secondary | ICD-10-CM | POA: Diagnosis not present

## 2016-10-21 DIAGNOSIS — D2261 Melanocytic nevi of right upper limb, including shoulder: Secondary | ICD-10-CM | POA: Diagnosis not present

## 2016-10-21 DIAGNOSIS — L820 Inflamed seborrheic keratosis: Secondary | ICD-10-CM | POA: Diagnosis not present

## 2016-10-21 DIAGNOSIS — L821 Other seborrheic keratosis: Secondary | ICD-10-CM | POA: Diagnosis not present

## 2016-10-21 DIAGNOSIS — D485 Neoplasm of uncertain behavior of skin: Secondary | ICD-10-CM | POA: Diagnosis not present

## 2017-04-09 ENCOUNTER — Other Ambulatory Visit: Payer: Self-pay | Admitting: Internal Medicine

## 2017-04-09 DIAGNOSIS — E785 Hyperlipidemia, unspecified: Secondary | ICD-10-CM

## 2017-04-20 ENCOUNTER — Other Ambulatory Visit (INDEPENDENT_AMBULATORY_CARE_PROVIDER_SITE_OTHER): Payer: BLUE CROSS/BLUE SHIELD

## 2017-04-20 ENCOUNTER — Encounter: Payer: Self-pay | Admitting: Internal Medicine

## 2017-04-20 ENCOUNTER — Ambulatory Visit (INDEPENDENT_AMBULATORY_CARE_PROVIDER_SITE_OTHER): Payer: BLUE CROSS/BLUE SHIELD | Admitting: Internal Medicine

## 2017-04-20 VITALS — BP 122/80 | HR 64 | Temp 97.9°F | Resp 16 | Ht 73.0 in | Wt 198.0 lb

## 2017-04-20 DIAGNOSIS — Z0000 Encounter for general adult medical examination without abnormal findings: Secondary | ICD-10-CM

## 2017-04-20 DIAGNOSIS — E785 Hyperlipidemia, unspecified: Secondary | ICD-10-CM

## 2017-04-20 LAB — COMPREHENSIVE METABOLIC PANEL
ALBUMIN: 4.7 g/dL (ref 3.5–5.2)
ALT: 27 U/L (ref 0–53)
AST: 24 U/L (ref 0–37)
Alkaline Phosphatase: 59 U/L (ref 39–117)
BUN: 14 mg/dL (ref 6–23)
CALCIUM: 9.3 mg/dL (ref 8.4–10.5)
CHLORIDE: 103 meq/L (ref 96–112)
CO2: 29 mEq/L (ref 19–32)
Creatinine, Ser: 1.06 mg/dL (ref 0.40–1.50)
GFR: 76.61 mL/min (ref >60.00)
Glucose, Bld: 94 mg/dL (ref 70–99)
POTASSIUM: 4.6 meq/L (ref 3.5–5.1)
SODIUM: 139 meq/L (ref 135–145)
Total Bilirubin: 0.8 mg/dL (ref 0.2–1.2)
Total Protein: 7.1 g/dL (ref 6.0–8.3)

## 2017-04-20 LAB — CBC WITH DIFFERENTIAL/PLATELET
BASOS PCT: 0.8 % (ref 0.0–3.0)
Basophils Absolute: 0.1 10*3/uL (ref 0.0–0.1)
EOS PCT: 3.5 % (ref 0.0–5.0)
Eosinophils Absolute: 0.3 10*3/uL (ref 0.0–0.7)
HCT: 46.2 % (ref 39.0–52.0)
HEMOGLOBIN: 16.2 g/dL (ref 13.0–17.0)
Lymphocytes Relative: 21.6 % (ref 12.0–46.0)
Lymphs Abs: 1.6 10*3/uL (ref 0.7–4.0)
MCHC: 34.9 g/dL (ref 30.0–36.0)
MCV: 88.7 fl (ref 78.0–100.0)
MONO ABS: 0.4 10*3/uL (ref 0.1–1.0)
Monocytes Relative: 5.4 % (ref 3.0–12.0)
NEUTROS ABS: 5.2 10*3/uL (ref 1.4–7.7)
Neutrophils Relative %: 68.7 % (ref 43.0–77.0)
Platelets: 211 10*3/uL (ref 150.0–400.0)
RBC: 5.21 Mil/uL (ref 4.22–5.81)
RDW: 13.5 % (ref 11.5–15.5)
WBC: 7.6 10*3/uL (ref 4.0–10.5)

## 2017-04-20 LAB — LIPID PANEL
CHOLESTEROL: 160 mg/dL (ref 0–200)
HDL: 49.3 mg/dL (ref >39.00)
LDL CALC: 91 mg/dL (ref 0–99)
NonHDL: 110.24
TRIGLYCERIDES: 94 mg/dL (ref 0.0–149.0)
Total CHOL/HDL Ratio: 3
VLDL: 18.8 mg/dL (ref 0.0–40.0)

## 2017-04-20 LAB — PSA: PSA: 1.21 ng/mL (ref 0.10–4.00)

## 2017-04-20 NOTE — Progress Notes (Signed)
Subjective:  Patient ID: Daniel Allen, male    DOB: 01/09/1961  Age: 57 y.o. MRN: [id number]  CC: Annual Exam and Hyperlipidemia   HPI [name] presents for a CPX.  He continues to be very active and denies any recent episodes of CP, DOE, palpitations, edema, or fatigue.  He is tolerating his statin well with no muscle or joint aches.  Outpatient Medications Prior to Visit  Medication Sig Dispense Refill  . aspirin EC 81 MG tablet Take by mouth.    Marland Kitchen atorvastatin (LIPITOR) 10 MG tablet TAKE 1 TABLET(10 MG) BY MOUTH DAILY 30 tablet 0  . aspirin EC 81 MG tablet Take 1 tablet (81 mg total) by mouth daily. 90 tablet 3  . celecoxib (CELEBREX) 200 MG capsule Take 1 capsule (200 mg total) by mouth daily. 90 capsule 1  . traMADol (ULTRAM) 50 MG tablet Take 1 tablet (50 mg total) by mouth every 8 (eight) hours as needed. 30 tablet 0   No facility-administered medications prior to visit.     ROS Review of Systems  Constitutional: Negative.  Negative for appetite change, diaphoresis, fatigue and unexpected weight change.  HENT: Negative.   Eyes: Negative for visual disturbance.  Respiratory: Negative.  Negative for cough, chest tightness, shortness of breath and wheezing.   Cardiovascular: Negative for chest pain, palpitations and leg swelling.  Gastrointestinal: Negative.  Negative for abdominal pain, constipation, diarrhea and vomiting.  Endocrine: Negative.   Genitourinary: Negative.  Negative for difficulty urinating.  Musculoskeletal: Negative.  Negative for arthralgias and myalgias.  Skin: Negative.  Negative for color change and pallor.  Allergic/Immunologic: Negative.   Neurological: Negative.  Negative for dizziness, weakness and headaches.  Hematological: Negative for adenopathy. Does not bruise/bleed easily.  Psychiatric/Behavioral: Negative.     Objective:  BP 122/80 (BP Location: Left Arm, Patient Position: Sitting, Cuff Size: Normal)   Pulse 64   Temp 97.9 F  (36.6 C) (Oral)   Resp 16   Ht 6' 1"  (1.854 m)   Wt 198 lb (89.8 kg)   SpO2 98%   BMI 26.12 kg/m   BP Readings from Last 3 Encounters:  04/20/17 122/80  06/24/16 138/90  06/11/16 (!) 142/80    Wt Readings from Last 3 Encounters:  04/20/17 198 lb (89.8 kg)  06/24/16 191 lb (86.6 kg)  06/11/16 191 lb (86.6 kg)    Physical Exam  Constitutional: He is oriented to person, place, and time. He appears well-developed and well-nourished. No distress.  HENT:  Mouth/Throat: Oropharynx is clear and moist. No oropharyngeal exudate.  Eyes: Conjunctivae are normal. No scleral icterus.  Neck: Normal range of motion. Neck supple. No JVD present. No thyromegaly present.  Cardiovascular: Normal rate, regular rhythm and normal heart sounds. Exam reveals no gallop and no friction rub.  No murmur heard. Pulmonary/Chest: Effort normal and breath sounds normal. No stridor. No respiratory distress. He has no wheezes. He has no rales. He exhibits no tenderness.  Abdominal: Soft. Bowel sounds are normal. He exhibits no distension and no mass. There is no tenderness. No hernia. Hernia confirmed negative in the right inguinal area and confirmed negative in the left inguinal area.  Genitourinary: Rectum normal, prostate normal, testes normal and penis normal. Rectal exam shows no external hemorrhoid, no internal hemorrhoid, no fissure, no mass, no tenderness, anal tone normal and guaiac negative stool. Prostate is not enlarged and not tender. Right testis shows no mass, no swelling and no tenderness. Left testis shows no mass,  no swelling and no tenderness. Circumcised. No penile erythema or penile tenderness. No discharge found.  Musculoskeletal: Normal range of motion. He exhibits no edema, tenderness or deformity.  Lymphadenopathy:    He has no cervical adenopathy. No inguinal adenopathy noted on the right or left side.  Neurological: He is alert and oriented to person, place, and time.  Skin: Skin is warm  and dry. No rash noted. He is not diaphoretic. No erythema. No pallor.  Vitals reviewed.   Lab Results  Component Value Date   WBC 7.6 04/20/2017   HGB 16.2 04/20/2017   HCT 46.2 04/20/2017   PLT 211.0 04/20/2017   GLUCOSE 94 04/20/2017   CHOL 160 04/20/2017   TRIG 94.0 04/20/2017   HDL 49.30 04/20/2017   LDLDIRECT 162.9 09/14/2013   LDLCALC 91 04/20/2017   ALT 27 04/20/2017   AST 24 04/20/2017   NA 139 04/20/2017   K 4.6 04/20/2017   CL 103 04/20/2017   CREATININE 1.06 04/20/2017   BUN 14 04/20/2017   CO2 29 04/20/2017   TSH 1.71 04/15/2016   PSA 1.21 04/20/2017    Dg Lumbar Spine Complete  Result Date: 06/24/2016 CLINICAL DATA:  Right posterior iliac crest pain for the past 2 weeks. EXAM: LUMBAR SPINE - COMPLETE 4+ VIEW COMPARISON:  Abdomen and pelvis CT dated 06/14/2016. FINDINGS: Five non-rib-bearing lumbar vertebrae. Facet degenerative changes at multiple levels. Mild anterior spur formation at the L3-4 and L4-5 levels. No fractures, pars defects or subluxations. Minimal atheromatous arterial calcifications. IMPRESSION: No acute abnormality.  Multilevel degenerative changes. Electronically Signed   By: Claudie Revering M.D.   On: 06/24/2016 13:30    Assessment & Plan:   Alexsandro was seen today for annual exam and hyperlipidemia.  Diagnoses and all orders for this visit:  Routine general medical examination at a health care facility- Exam completed, labs ordered and reviewed, vaccines reviewed, he refused a flu vaccine, patient education material was given.  Screening for colon cancer is up-to-date. -     Lipid panel; Future -     PSA; Future  Hyperlipidemia LDL goal <100- He has achieved his LDL goal and is doing well with the statin. -     Comprehensive metabolic panel; Future -     CBC with Differential/Platelet; Future   I have discontinued Prem Coykendall. Osmundson's traMADol and celecoxib. I am also having him maintain his atorvastatin and aspirin EC.  No orders of the  defined types were placed in this encounter.    Follow-up: Return in about 1 year (around 04/21/2018).  Scarlette Calico, MD

## 2017-04-20 NOTE — Patient Instructions (Signed)

## 2017-05-14 ENCOUNTER — Other Ambulatory Visit: Payer: Self-pay | Admitting: Internal Medicine

## 2017-05-14 DIAGNOSIS — E785 Hyperlipidemia, unspecified: Secondary | ICD-10-CM

## 2017-08-23 DIAGNOSIS — H52223 Regular astigmatism, bilateral: Secondary | ICD-10-CM | POA: Diagnosis not present

## 2017-08-23 DIAGNOSIS — H524 Presbyopia: Secondary | ICD-10-CM | POA: Diagnosis not present

## 2017-08-23 DIAGNOSIS — H5203 Hypermetropia, bilateral: Secondary | ICD-10-CM | POA: Diagnosis not present

## 2017-10-24 DIAGNOSIS — D2262 Melanocytic nevi of left upper limb, including shoulder: Secondary | ICD-10-CM | POA: Diagnosis not present

## 2017-10-24 DIAGNOSIS — C44519 Basal cell carcinoma of skin of other part of trunk: Secondary | ICD-10-CM | POA: Diagnosis not present

## 2017-10-24 DIAGNOSIS — Z85828 Personal history of other malignant neoplasm of skin: Secondary | ICD-10-CM | POA: Diagnosis not present

## 2017-10-24 DIAGNOSIS — D2261 Melanocytic nevi of right upper limb, including shoulder: Secondary | ICD-10-CM | POA: Diagnosis not present

## 2017-10-24 DIAGNOSIS — L570 Actinic keratosis: Secondary | ICD-10-CM | POA: Diagnosis not present

## 2017-10-24 DIAGNOSIS — D2362 Other benign neoplasm of skin of left upper limb, including shoulder: Secondary | ICD-10-CM | POA: Diagnosis not present

## 2017-11-13 ENCOUNTER — Other Ambulatory Visit: Payer: Self-pay | Admitting: Internal Medicine

## 2017-11-13 DIAGNOSIS — E785 Hyperlipidemia, unspecified: Secondary | ICD-10-CM

## 2018-01-19 ENCOUNTER — Encounter: Payer: Self-pay | Admitting: Internal Medicine

## 2018-01-19 ENCOUNTER — Ambulatory Visit: Payer: BLUE CROSS/BLUE SHIELD | Admitting: Internal Medicine

## 2018-01-19 VITALS — BP 136/70 | HR 75 | Temp 97.9°F | Ht 73.0 in | Wt 194.5 lb

## 2018-01-19 DIAGNOSIS — L247 Irritant contact dermatitis due to plants, except food: Secondary | ICD-10-CM | POA: Diagnosis not present

## 2018-01-19 DIAGNOSIS — Z23 Encounter for immunization: Secondary | ICD-10-CM

## 2018-01-19 MED ORDER — TRIAMCINOLONE ACETONIDE 0.1 % EX CREA: TOPICAL_CREAM | Freq: Two times a day (BID) | CUTANEOUS | 0 refills | Status: AC

## 2018-01-19 MED ORDER — METHYLPREDNISOLONE ACETATE 80 MG/ML IJ SUSP
120.0000 mg | Freq: Once | INTRAMUSCULAR | Status: AC
  Administered 2018-01-19: 120 mg via INTRAMUSCULAR

## 2018-01-19 NOTE — Patient Instructions (Signed)
Poison Ivy Dermatitis  Poison ivy dermatitis is inflammation of the skin that is caused by the allergens on the leaves of the poison ivy plant. The skin reaction often involves redness, swelling, blisters, and extreme itching. What are the causes? This condition is caused by a specific chemical (urushiol) found in the sap of the poison ivy plant. This chemical is sticky and can be easily spread to people, animals, and objects. You can get poison ivy dermatitis by:  Having direct contact with a poison ivy plant.  Touching animals, other people, or objects that have come in contact with poison ivy and have the chemical on them. What increases the risk? This condition is more likely to develop in:  People who are outdoors often.  People who go outdoors without wearing protective clothing, such as closed shoes, long pants, and a long-sleeved shirt. What are the signs or symptoms? Symptoms of this condition include:  Redness and itching.  A rash that often includes bumps and blisters. The rash usually appears 48 hours after exposure.  Swelling. This may occur if the reaction is more severe. Symptoms usually last for 1-2 weeks. However, the first time you develop this condition, symptoms may last 3-4 weeks. How is this diagnosed? This condition may be diagnosed based on your symptoms and a physical exam. Your health care provider may also ask you about any recent outdoor activity. How is this treated? Treatment for this condition will vary depending on how severe it is. Treatment may include:  Hydrocortisone creams or calamine lotions to relieve itching.  Oatmeal baths to soothe the skin.  Over-the-counter antihistamine tablets.  Oral steroid medicine for more severe outbreaks. Follow these instructions at home:  Take or apply over-the-counter and prescription medicines only as told by your health care provider.  Wash exposed skin as soon as possible with soap and cold water.  Use  hydrocortisone creams or calamine lotion as needed to soothe the skin and relieve itching.  Take oatmeal baths as needed. Use colloidal oatmeal. You can get this at your local pharmacy or grocery store. Follow the instructions on the packaging.  Do not scratch or rub your skin.  While you have the rash, wash clothes right after you wear them. How is this prevented?   Learn to identify the poison ivy plant and avoid contact with the plant. This plant can be recognized by the number of leaves. Generally, poison ivy has three leaves with flowering branches on a single stem. The leaves are typically glossy, and they have jagged edges that come to a point at the front.  If you have been exposed to poison ivy, thoroughly wash with soap and water right away. You have about 30 minutes to remove the plant resin before it will cause the rash. Be sure to wash under your fingernails because any plant resin there will continue to spread the rash.  When hiking or camping, wear clothes that will help you to avoid exposure on the skin. This includes long pants, a long-sleeved shirt, tall socks, and hiking boots. You can also apply preventive lotion to your skin to help limit exposure.  If you suspect that your clothes or outdoor gear came in contact with poison ivy, rinse them off outside with a garden hose before you bring them inside your house. Contact a health care provider if:  You have open sores in the rash area.  You have more redness, swelling, or pain in the affected area.  You have redness that   spreads beyond the rash area.  You have fluid, blood, or pus coming from the affected area.  You have a fever.  You have a rash over a large area of your body.  You have a rash on your eyes, mouth, or genitals.  Your rash does not improve after a few days. Get help right away if:  Your face swells or your eyes swell shut.  You have trouble breathing.  You have trouble swallowing. This  information is not intended to replace advice given to you by your health care provider. Make sure you discuss any questions you have with your health care provider. Document Released: 12/26/1999 Document Revised: 06/10/2016 Document Reviewed: 06/05/2014 Elsevier Interactive Patient Education  2019 [employer] Inc[employer]  

## 2018-01-19 NOTE — Progress Notes (Signed)
Subjective:  Patient ID: Daniel Allen, male    DOB: 01-Aug-1960  Age: 58 y.o. MRN: [id number]  CC: Rash   HPI [name] presents for concerns about an itchy rash for 4 days.  One day after working in the yard (pulling up weeds and vines) he noticed an itchy rash on his left upper and lower eyelid, on the left side of his face, and the right side of his neck.  He has not treated this.  Outpatient Medications Prior to Visit  Medication Sig Dispense Refill  . aspirin EC 81 MG tablet Take by mouth.    Marland Kitchen atorvastatin (LIPITOR) 10 MG tablet TAKE 1 TABLET(10 MG) BY MOUTH DAILY 90 tablet 1   No facility-administered medications prior to visit.     ROS Review of Systems  Constitutional: Negative for diaphoresis, fatigue and fever.  HENT: Positive for facial swelling. Negative for sore throat, trouble swallowing and voice change.   Eyes: Negative for visual disturbance.  Respiratory: Negative for cough, chest tightness, shortness of breath and wheezing.   Cardiovascular: Negative for chest pain, palpitations and leg swelling.  Gastrointestinal: Negative for abdominal pain, constipation and diarrhea.  Genitourinary: Negative.  Negative for difficulty urinating.  Musculoskeletal: Negative.  Negative for arthralgias.  Skin: Positive for rash. Negative for color change.  Neurological: Negative.  Negative for dizziness, weakness and light-headedness.  Hematological: Negative for adenopathy. Does not bruise/bleed easily.  Psychiatric/Behavioral: Negative.     Objective:  BP 136/70 (BP Location: Left Arm, Patient Position: Sitting, Cuff Size: Normal)   Pulse 75   Temp 97.9 F (36.6 C) (Oral)   Ht 6' 1"  (1.854 m)   Wt 194 lb 8 oz (88.2 kg)   SpO2 98%   BMI 25.66 kg/m   BP Readings from Last 3 Encounters:  01/19/18 136/70  04/20/17 122/80  06/24/16 138/90    Wt Readings from Last 3 Encounters:  01/19/18 194 lb 8 oz (88.2 kg)  04/20/17 198 lb (89.8 kg)  06/24/16 191 lb (86.6  kg)    Physical Exam Vitals signs reviewed.  Constitutional:      General: He is not in acute distress.    Appearance: He is not ill-appearing or diaphoretic.  HENT:     Head:      Nose: Nose normal. No congestion or rhinorrhea.     Mouth/Throat:     Mouth: Mucous membranes are moist.     Pharynx: Oropharynx is clear. No posterior oropharyngeal erythema.  Eyes:     General: No scleral icterus.       Right eye: No discharge.        Left eye: No discharge.     Conjunctiva/sclera: Conjunctivae normal.  Neck:     Musculoskeletal: Normal range of motion and neck supple.   Cardiovascular:     Rate and Rhythm: Normal rate and regular rhythm.     Heart sounds: No gallop.   Pulmonary:     Effort: Pulmonary effort is normal.     Breath sounds: Normal breath sounds. No wheezing, rhonchi or rales.  Abdominal:     General: Abdomen is flat. Bowel sounds are normal.     Palpations: There is no mass.     Tenderness: There is no abdominal tenderness.  Musculoskeletal: Normal range of motion.        General: No swelling.     Right lower leg: No edema.     Left lower leg: No edema.  Skin:  General: Skin is warm and dry.     Findings: Rash present.     Comments: Over the left upper and lower eyelid there is diffuse, mild swelling and erythema.  Over the left medial malar surface there are 2 erythematous, mildly swollen macules.  On the right lateral side of the neck there are 3 slightly erythematous, mildly swollen macules.  There are no vesicles, pustules, streaking, induration, or fluctuance.  Neurological:     General: No focal deficit present.     Mental Status: He is oriented to person, place, and time. Mental status is at baseline.     Lab Results  Component Value Date   WBC 7.6 04/20/2017   HGB 16.2 04/20/2017   HCT 46.2 04/20/2017   PLT 211.0 04/20/2017   GLUCOSE 94 04/20/2017   CHOL 160 04/20/2017   TRIG 94.0 04/20/2017   HDL 49.30 04/20/2017   LDLDIRECT 162.9  09/14/2013   LDLCALC 91 04/20/2017   ALT 27 04/20/2017   AST 24 04/20/2017   NA 139 04/20/2017   K 4.6 04/20/2017   CL 103 04/20/2017   CREATININE 1.06 04/20/2017   BUN 14 04/20/2017   CO2 29 04/20/2017   TSH 1.71 04/15/2016   PSA 1.21 04/20/2017    Dg Lumbar Spine Complete  Result Date: 06/24/2016 CLINICAL DATA:  Right posterior iliac crest pain for the past 2 weeks. EXAM: LUMBAR SPINE - COMPLETE 4+ VIEW COMPARISON:  Abdomen and pelvis CT dated 06/14/2016. FINDINGS: Five non-rib-bearing lumbar vertebrae. Facet degenerative changes at multiple levels. Mild anterior spur formation at the L3-4 and L4-5 levels. No fractures, pars defects or subluxations. Minimal atheromatous arterial calcifications. IMPRESSION: No acute abnormality.  Multilevel degenerative changes. Electronically Signed   By: Claudie Revering M.D.   On: 06/24/2016 13:30    Assessment & Plan:   Greig was seen today for rash.  Diagnoses and all orders for this visit:  Contact dermatitis and eczema due to plant -     triamcinolone cream (KENALOG) 0.1 %; Apply topically 2 (two) times daily for 7 days. -     methylPREDNISolone acetate (DEPO-MEDROL) injection 120 mg  Need for influenza vaccination -     Flu Vaccine QUAD 36+ mos IM   I have changed Alvester Chou C. Hoiland's triamcinolone cream. I am also having him maintain his aspirin EC and atorvastatin. We administered methylPREDNISolone acetate.  Meds ordered this encounter  Medications  . triamcinolone cream (KENALOG) 0.1 %    Sig: Apply topically 2 (two) times daily for 7 days.    Dispense:  80 g    Refill:  0  . methylPREDNISolone acetate (DEPO-MEDROL) injection 120 mg     Follow-up: Return if symptoms worsen or fail to improve.  Scarlette Calico, MD

## 2018-02-08 ENCOUNTER — Encounter: Payer: Self-pay | Admitting: Family

## 2018-02-08 ENCOUNTER — Ambulatory Visit: Payer: BLUE CROSS/BLUE SHIELD | Admitting: Family

## 2018-02-08 VITALS — BP 140/80 | HR 86 | Temp 98.3°F | Ht 73.0 in | Wt 192.0 lb

## 2018-02-08 DIAGNOSIS — J209 Acute bronchitis, unspecified: Secondary | ICD-10-CM

## 2018-02-08 MED ORDER — AZITHROMYCIN 250 MG PO TABS: ORAL_TABLET | ORAL | 0 refills | Status: DC

## 2018-02-08 NOTE — Progress Notes (Signed)
Daniel Allen is a 58 y.o. male with the following history as recorded in Epic[employer]:  Patient Active Problem List   Diagnosis Date Noted  . Contact dermatitis and eczema due to plant 01/19/2018  . Diverticulitis of colon without hemorrhage 01/25/2014  . Hyperlipidemia LDL goal <100 09/18/2013  . Routine general medical examination at a health care facility 08/18/2011  . PAC (premature atrial contraction) 08/18/2011  . Crohn's disease ([place]) 04/02/2010  . GERD 08/26/2009    Current Outpatient Medications  Medication Sig Dispense Refill  . aspirin EC 81 MG tablet Take by mouth.    Marland Kitchen atorvastatin (LIPITOR) 10 MG tablet TAKE 1 TABLET(10 MG) BY MOUTH DAILY 90 tablet 1  . azithromycin (ZITHROMAX) 250 MG tablet 2 tabs po qd x 1 day; 1 tablet per day x 4 days; 6 tablet 0   No current facility-administered medications for this visit.     Allergies: Patient has no known allergies.  Past Medical History:  Diagnosis Date  . Diverticulosis of colon   . History of basal cell carcinoma excision    NOSE--  Feb 2017  . History of colitis    crohn's colitis  . Hyperlipidemia   . PAC (premature atrial contraction)   . Recurrent right inguinal hernia   . Treadmill stress test negative for angina pectoris    03-13-2015 (dr skains)  no evidence of exercised induced ischemia and normal BP response  . Wears glasses     Past Surgical History:  Procedure Laterality Date  . COLONOSCOPY  last one 09-29-2012  . INGUINAL HERNIA REPAIR Right 2000  . INGUINAL HERNIA REPAIR Right 04/15/2015   Procedure: LAPAROSCOPIC RIGHT  INGUINAL HERNIA;  Surgeon: Arta Bruce Kinsinger, MD;  Location: [facility];  Service: General;  Laterality: Right;  . INSERTION OF MESH Right 04/15/2015   Procedure: INSERTION OF MESH;  Surgeon: Mickeal Skinner, MD;  Location: [facility];  Service: General;  Laterality: Right;    Family History  Problem Relation Age of Onset  . Cancer Father    leukemia  . Heart disease Father   . Colon cancer Neg Hx   . Esophageal cancer Neg Hx   . Stomach cancer Neg Hx   . Alcohol abuse Neg Hx   . Diabetes Neg Hx   . Early death Neg Hx   . Hyperlipidemia Neg Hx   . Hypertension Neg Hx   . Stroke Neg Hx   . Parkinson's disease Mother     Social History   Tobacco Use  . Smoking status: Never Smoker  . Smokeless tobacco: Never Used  Substance Use Topics  . Alcohol use: Yes    Comment: OCCASIONAL    Subjective:  Started with cold symptoms last Friday- achy, weak, sore throat; has progressed into bad cough/ "just doesn't feel 100%." Taking OTC cold medications with limited benefit; symptoms become more problematic by early evening/ night-time.    Objective:  Vitals:   02/08/18 1349  BP: 140/80  Pulse: 86  Temp: 98.3 F (36.8 C)  TempSrc: Oral  SpO2: 99%  Weight: 192 lb 0.6 oz (87.1 kg)  Height: 6' 1"  (1.854 m)    General: Well developed, well nourished, in no acute distress  Skin : Warm and dry.  Head: Normocephalic and atraumatic  Eyes: Sclera and conjunctiva clear; pupils round and reactive to light; extraocular movements intact  Ears: External normal; canals clear; tympanic membranes normal  Oropharynx: Pink, supple. No suspicious lesions  Neck:  Supple without thyromegaly, adenopathy  Lungs: Respirations unlabored; clear to auscultation bilaterally without wheeze, rales, rhonchi  CVS exam: normal rate and regular rhythm.  Neurologic: Alert and oriented; speech intact; face symmetrical; moves all extremities well; CNII-XII intact without focal deficit   Assessment:  1. Acute bronchitis, unspecified organism     Plan:  Rx for Z-pak #1 take as directed; continue OTC cough medication; increase fluids,rest and follow-up worse, no better.   No follow-ups on file.  No orders of the defined types were placed in this encounter.   Requested Prescriptions   Signed Prescriptions Disp Refills  . azithromycin (ZITHROMAX) 250  MG tablet 6 tablet 0    Sig: 2 tabs po qd x 1 day; 1 tablet per day x 4 days;

## 2018-03-02 ENCOUNTER — Other Ambulatory Visit: Payer: Self-pay | Admitting: Internal Medicine

## 2018-03-02 ENCOUNTER — Encounter: Payer: Self-pay | Admitting: Internal Medicine

## 2018-03-02 ENCOUNTER — Other Ambulatory Visit (INDEPENDENT_AMBULATORY_CARE_PROVIDER_SITE_OTHER): Payer: BLUE CROSS/BLUE SHIELD

## 2018-03-02 ENCOUNTER — Ambulatory Visit
Admission: RE | Admit: 2018-03-02 | Discharge: 2018-03-02 | Disposition: A | Discharge status: Home / Self Care | Payer: BLUE CROSS/BLUE SHIELD | Source: Ambulatory Visit | Attending: Internal Medicine | Admitting: Internal Medicine

## 2018-03-02 ENCOUNTER — Ambulatory Visit: Payer: BLUE CROSS/BLUE SHIELD | Admitting: Internal Medicine

## 2018-03-02 ENCOUNTER — Ambulatory Visit (INDEPENDENT_AMBULATORY_CARE_PROVIDER_SITE_OTHER)
Admission: RE | Admit: 2018-03-02 | Discharge: 2018-03-02 | Disposition: A | Discharge status: Home / Self Care | Payer: BLUE CROSS/BLUE SHIELD | Source: Ambulatory Visit | Attending: Internal Medicine | Admitting: Internal Medicine

## 2018-03-02 VITALS — BP 128/80 | HR 76 | Temp 98.1°F | Ht 73.0 in | Wt 188.8 lb

## 2018-03-02 DIAGNOSIS — K50918 Crohn's disease, unspecified, with other complication: Secondary | ICD-10-CM

## 2018-03-02 DIAGNOSIS — K5090 Crohn's disease, unspecified, without complications: Secondary | ICD-10-CM

## 2018-03-02 DIAGNOSIS — R10815 Periumbilic abdominal tenderness: Secondary | ICD-10-CM

## 2018-03-02 DIAGNOSIS — R1033 Periumbilical pain: Secondary | ICD-10-CM | POA: Diagnosis not present

## 2018-03-02 DIAGNOSIS — K5730 Diverticulosis of large intestine without perforation or abscess without bleeding: Secondary | ICD-10-CM | POA: Diagnosis not present

## 2018-03-02 LAB — COMPREHENSIVE METABOLIC PANEL
ALBUMIN: 4.3 g/dL (ref 3.5–5.2)
ALT: 21 U/L (ref 0–53)
AST: 17 U/L (ref 0–37)
Alkaline Phosphatase: 74 U/L (ref 39–117)
BUN: 15 mg/dL (ref 6–23)
CO2: 31 mEq/L (ref 19–32)
Calcium: 9.1 mg/dL (ref 8.4–10.5)
Chloride: 103 mEq/L (ref 96–112)
Creatinine, Ser: 1 mg/dL (ref 0.40–1.50)
GFR: 76.86 mL/min (ref >60.00)
Glucose, Bld: 130 mg/dL — ABNORMAL HIGH (ref 70–99)
Potassium: 3.9 mEq/L (ref 3.5–5.1)
Sodium: 141 mEq/L (ref 135–145)
Total Bilirubin: 0.5 mg/dL (ref 0.2–1.2)
Total Protein: 7.4 g/dL (ref 6.0–8.3)

## 2018-03-02 LAB — CBC WITH DIFFERENTIAL/PLATELET
Basophils Absolute: 0.1 10*3/uL (ref 0.0–0.1)
Basophils Relative: 0.8 % (ref 0.0–3.0)
Eosinophils Absolute: 0.3 10*3/uL (ref 0.0–0.7)
Eosinophils Relative: 2.3 % (ref 0.0–5.0)
HCT: 44.6 % (ref 39.0–52.0)
Hemoglobin: 15.1 g/dL (ref 13.0–17.0)
Lymphocytes Relative: 10.3 % — ABNORMAL LOW (ref 12.0–46.0)
Lymphs Abs: 1.1 10*3/uL (ref 0.7–4.0)
MCHC: 33.8 g/dL (ref 30.0–36.0)
MCV: 90.4 fl (ref 78.0–100.0)
Monocytes Absolute: 0.5 10*3/uL (ref 0.1–1.0)
Monocytes Relative: 4.8 % (ref 3.0–12.0)
NEUTROS ABS: 8.8 10*3/uL — AB (ref 1.4–7.7)
Neutrophils Relative %: 81.8 % — ABNORMAL HIGH (ref 43.0–77.0)
PLATELETS: 263 10*3/uL (ref 150.0–400.0)
RBC: 4.94 Mil/uL (ref 4.22–5.81)
RDW: 13.3 % (ref 11.5–15.5)
WBC: 10.8 10*3/uL — ABNORMAL HIGH (ref 4.0–10.5)

## 2018-03-02 LAB — URINALYSIS, ROUTINE W REFLEX MICROSCOPIC
Bilirubin Urine: NEGATIVE
Hgb urine dipstick: NEGATIVE
Ketones, ur: NEGATIVE
Leukocytes,Ua: NEGATIVE
Nitrite: NEGATIVE
PH: 6 (ref 5.0–8.0)
SPECIFIC GRAVITY, URINE: 1.025 (ref 1.000–1.030)
Total Protein, Urine: NEGATIVE
Urine Glucose: NEGATIVE
Urobilinogen, UA: 0.2 (ref 0.0–1.0)

## 2018-03-02 LAB — SEDIMENTATION RATE: Sed Rate: 31 mm/hr — ABNORMAL HIGH (ref 0–20)

## 2018-03-02 LAB — LIPASE: Lipase: 24 U/L (ref 11.0–59.0)

## 2018-03-02 LAB — AMYLASE: Amylase: 40 U/L (ref 27–131)

## 2018-03-02 MED ORDER — IOPAMIDOL (ISOVUE-300) INJECTION 61%
100.0000 mL | Freq: Once | INTRAVENOUS | Status: AC | PRN
  Administered 2018-03-02: 100 mL via INTRAVENOUS

## 2018-03-02 NOTE — Patient Instructions (Signed)
Abdominal Pain, Adult Abdominal pain can be caused by many things. Often, abdominal pain is not serious and it gets better with no treatment or by being treated at home. However, sometimes abdominal pain is serious. Your health care provider will do a medical history and a physical exam to try to determine the cause of your abdominal pain. Follow these instructions at home:  Take over-the-counter and prescription medicines only as told by your health care provider. Do not take a laxative unless told by your health care provider.  Drink enough fluid to keep your urine clear or pale yellow.  Watch your condition for any changes.  Keep all follow-up visits as told by your health care provider. This is important. Contact a health care provider if:  Your abdominal pain changes or gets worse.  You are not hungry or you lose weight without trying.  You are constipated or have diarrhea for more than 2-3 days.  You have pain when you urinate or have a bowel movement.  Your abdominal pain wakes you up at night.  Your pain gets worse with meals, after eating, or with certain foods.  You are throwing up and cannot keep anything down.  You have a fever. Get help right away if:  Your pain does not go away as soon as your health care provider told you to expect.  You cannot stop throwing up.  Your pain is only in areas of the abdomen, such as the right side or the left lower portion of the abdomen.  You have bloody or black stools, or stools that look like tar.  You have severe pain, cramping, or bloating in your abdomen.  You have signs of dehydration, such as: ? Dark urine, very little urine, or no urine. ? Cracked lips. ? Dry mouth. ? Sunken eyes. ? Sleepiness. ? Weakness. This information is not intended to replace advice given to you by your health care provider. Make sure you discuss any questions you have with your health care provider. Document Released: 10/07/2004 Document  Revised: 07/18/2015 Document Reviewed: 06/11/2015 Elsevier Interactive Patient Education  2019 [employer].

## 2018-03-02 NOTE — Progress Notes (Signed)
Subjective:  Patient ID: Daniel Allen, male    DOB: October 29, 1960  Age: 58 y.o. MRN: [id number]  CC: Abdominal Pain   HPI OSTIN MATHEY presents for a 4-day history of intermittent, sharp, lower, midline abdominal pain that is relieved with passing gas or having a bowel movement.  He complains of nausea but denies vomiting or loss of appetite.  He also complains of dysuria but denies hematuria.  Outpatient Medications Prior to Visit  Medication Sig Dispense Refill  . aspirin EC 81 MG tablet Take by mouth.    Marland Kitchen atorvastatin (LIPITOR) 10 MG tablet TAKE 1 TABLET(10 MG) BY MOUTH DAILY 90 tablet 1  . azithromycin (ZITHROMAX) 250 MG tablet 2 tabs po qd x 1 day; 1 tablet per day x 4 days; 6 tablet 0   No facility-administered medications prior to visit.     ROS Review of Systems  Constitutional: Negative for appetite change, diaphoresis, fatigue, fever and unexpected weight change.  HENT: Negative.  Negative for trouble swallowing.   Respiratory: Negative for cough, chest tightness, shortness of breath and wheezing.   Cardiovascular: Negative for chest pain, palpitations and leg swelling.  Gastrointestinal: Positive for abdominal pain and nausea. Negative for anal bleeding, constipation, rectal pain and vomiting.  Genitourinary: Positive for dysuria. Negative for difficulty urinating, discharge, flank pain, frequency, hematuria, scrotal swelling and urgency.  Musculoskeletal: Negative for arthralgias and myalgias.  Skin: Negative.  Negative for rash.  Neurological: Negative.   Hematological: Negative for adenopathy. Does not bruise/bleed easily.  Psychiatric/Behavioral: Negative.     Objective:  BP 128/80 (BP Location: Left Arm, Patient Position: Sitting, Cuff Size: Normal)   Pulse 76   Temp 98.1 F (36.7 C) (Oral)   Ht 6' 1"  (1.854 m)   Wt 188 lb 12 oz (85.6 kg)   SpO2 98%   BMI 24.90 kg/m   BP Readings from Last 3 Encounters:  03/02/18 128/80  02/08/18 140/80  01/19/18  136/70    Wt Readings from Last 3 Encounters:  03/02/18 188 lb 12 oz (85.6 kg)  02/08/18 192 lb 0.6 oz (87.1 kg)  01/19/18 194 lb 8 oz (88.2 kg)    Physical Exam Nursing note reviewed.  Constitutional:      Appearance: He is not ill-appearing or diaphoretic.  HENT:     Mouth/Throat:     Mouth: Mucous membranes are moist.     Pharynx: No pharyngeal swelling, oropharyngeal exudate or posterior oropharyngeal erythema.  Cardiovascular:     Rate and Rhythm: Normal rate and regular rhythm.     Heart sounds: No murmur. No gallop.   Pulmonary:     Effort: Pulmonary effort is normal. No respiratory distress.     Breath sounds: Normal breath sounds. No stridor. No wheezing, rhonchi or rales.  Abdominal:     General: Abdomen is flat. Bowel sounds are normal.     Palpations: Abdomen is soft.     Tenderness: There is abdominal tenderness in the periumbilical area and suprapubic area. There is rebound. There is no right CVA tenderness, left CVA tenderness or guarding. Negative signs include Murphy's sign, Rovsing's sign, McBurney's sign, psoas sign and obturator sign.     Hernia: No hernia is present. There is no hernia in the right inguinal area or left inguinal area.  Genitourinary:    Pubic Area: No rash.      Penis: Circumcised. No discharge, swelling or lesions.      Scrotum/Testes: Normal.  Right: Mass, tenderness or swelling not present.        Left: Mass, tenderness or swelling not present.     Epididymis:     Right: Normal. Not inflamed.     Left: Normal. Not inflamed.     Prostate: Normal. Not enlarged, not tender and no nodules present.     Rectum: Normal. Guaiac result negative. No mass, tenderness, anal fissure, external hemorrhoid or internal hemorrhoid. Normal anal tone.  Musculoskeletal: Normal range of motion.        General: No swelling.     Right lower leg: No edema.  Lymphadenopathy:     Lower Body: No right inguinal adenopathy. No left inguinal adenopathy.    Skin:    General: Skin is warm and dry.     Findings: No rash.  Neurological:     General: No focal deficit present.     Mental Status: He is alert and oriented to person, place, and time. Mental status is at baseline.     Lab Results  Component Value Date   WBC 10.8 (H) 03/02/2018   HGB 15.1 03/02/2018   HCT 44.6 03/02/2018   PLT 263.0 03/02/2018   GLUCOSE 130 (H) 03/02/2018   CHOL 160 04/20/2017   TRIG 94.0 04/20/2017   HDL 49.30 04/20/2017   LDLDIRECT 162.9 09/14/2013   LDLCALC 91 04/20/2017   ALT 21 03/02/2018   AST 17 03/02/2018   NA 141 03/02/2018   K 3.9 03/02/2018   CL 103 03/02/2018   CREATININE 1.00 03/02/2018   BUN 15 03/02/2018   CO2 31 03/02/2018   TSH 1.71 04/15/2016   PSA 1.21 04/20/2017    Dg Lumbar Spine Complete  Result Date: 06/24/2016 CLINICAL DATA:  Right posterior iliac crest pain for the past 2 weeks. EXAM: LUMBAR SPINE - COMPLETE 4+ VIEW COMPARISON:  Abdomen and pelvis CT dated 06/14/2016. FINDINGS: Five non-rib-bearing lumbar vertebrae. Facet degenerative changes at multiple levels. Mild anterior spur formation at the L3-4 and L4-5 levels. No fractures, pars defects or subluxations. Minimal atheromatous arterial calcifications. IMPRESSION: No acute abnormality.  Multilevel degenerative changes. Electronically Signed   By: Claudie Revering M.D.   On: 06/24/2016 13:30    Dg Abd Acute 2+v W 1v Chest  Result Date: 03/02/2018 CLINICAL DATA:  Acute periumbilical abdominal pain. EXAM: DG ABDOMEN ACUTE W/ 1V CHEST COMPARISON:  None. FINDINGS: There is no evidence of dilated bowel loops or free intraperitoneal air. No radiopaque calculi or other significant radiographic abnormality is seen. Heart size and mediastinal contours are within normal limits. Both lungs are clear. IMPRESSION: No evidence of bowel obstruction or ileus. No acute cardiopulmonary disease. Electronically Signed   By: Marijo Conception, M.D.   On: 03/02/2018 09:14    Assessment & Plan:    Lysle was seen today for abdominal pain.  Diagnoses and all orders for this visit:  Periumbilical abdominal tenderness without rebound tenderness- He has abdominal tenderness with mild rebound and an elevated white cell count with a shift to the left.  His sed rate is also mildly elevated.  I recommended an urgent CT of the abdomen and pelvis with contrast to Allen if there is an abscess associated with diverticulitis or appendicitis.  Will also look at the CT to screen for complications related to IBD. -     CBC with Differential/Platelet; Future -     Comprehensive metabolic panel; Future -     Sedimentation rate; Future -     Urinalysis, Routine w  reflex microscopic; Future -     Lipase; Future -     Amylase; Future -     DG ABD ACUTE 2+V W 1V CHEST; Future -     CT Abdomen Pelvis W Contrast; Future  Crohn's disease with other complication, unspecified gastrointestinal tract location [facility] Allen above -     CBC with Differential/Platelet; Future -     Comprehensive metabolic panel; Future -     Sedimentation rate; Future -     Urinalysis, Routine w reflex microscopic; Future -     Lipase; Future -     Amylase; Future -     DG ABD ACUTE 2+V W 1V CHEST; Future -     CT Abdomen Pelvis W Contrast; Future   I have discontinued Alvester Chou C. Soohoo's azithromycin. I am also having him maintain his aspirin EC and atorvastatin.  No orders of the defined types were placed in this encounter.    Follow-up: Return in about 1 week (around 03/09/2018).  Scarlette Calico, MD

## 2018-03-17 ENCOUNTER — Encounter: Payer: Self-pay | Admitting: Gastroenterology

## 2018-04-24 ENCOUNTER — Encounter: Payer: BLUE CROSS/BLUE SHIELD | Admitting: Internal Medicine

## 2018-04-24 ENCOUNTER — Ambulatory Visit (INDEPENDENT_AMBULATORY_CARE_PROVIDER_SITE_OTHER): Payer: BLUE CROSS/BLUE SHIELD | Admitting: Gastroenterology

## 2018-04-24 ENCOUNTER — Other Ambulatory Visit: Payer: Self-pay

## 2018-04-24 DIAGNOSIS — K5010 Crohn's disease of large intestine without complications: Secondary | ICD-10-CM

## 2018-04-24 DIAGNOSIS — R1030 Lower abdominal pain, unspecified: Secondary | ICD-10-CM

## 2018-04-24 NOTE — Progress Notes (Signed)
_____________________________________________________________________________________________   This patient contacted our office requesting a physician telemedicine video consultation regarding clinical questions and/or test results.  If new patient, they were referred by Scarlette Calico, MD  Participants on the webex : myself and the patient   The patient consented to phone consultation and was aware that a charge will be placed through their insurance.  I was in my office and the patient was at work (office)   Encounter time:  Total time 45 minutes, with 30 minutes spent with patient on phone/webex    Daniel Lund, MD   _____________________________________________________________________________________________                [facility] Consult Note:  History: Daniel Allen 04/24/2018  Referring physician: Janith Lima, MD  Reason for consult/chief complaint: No chief complaint on file. Lower abdominal pain  Subjective  HPI:  This is a new patient to me, previously seen by Dr. Alben Spittle.  Saw PCP mid Feb for mid abd pain.  Hx Crohn's disease. He was having sharp lower abdominal pain for several days prior to the primary care visit.  There were no associated symptoms such as fever, change in bowel habits, nausea or vomiting.  The pain subsided after several days and has not returned since then.  12/2010 Deatra Ina office note reports 15 yrs of Crohn's colitis under control on asacol. Colon 11/2010 with asc colitis with ulcers.  Bx active IBD w/o dysplasia. Initially Dx in [place]. 09/2012 colonoscopy to TI normal except mildly erythematous mucosa in distal rectum.  Bxs random colon and rectum normal. Was told to stop it somewhere along the way (unclear before or after either of these exams).Felt well/same after stopping Asacol.  Normal BMs, no rectal bleeding.  ROS:  Review of Systems No chest pain, dyspnea, urinary symptoms  Past  Medical History: Past Medical History:  Diagnosis Date  . Diverticulosis of colon   . History of basal cell carcinoma excision    NOSE--  Feb 2017  . History of colitis    crohn's colitis  . Hyperlipidemia   . PAC (premature atrial contraction)   . Recurrent right inguinal hernia   . Treadmill stress test negative for angina pectoris    03-13-2015 (dr skains)  no evidence of exercised induced ischemia and normal BP response  . Wears glasses      Past Surgical History: Past Surgical History:  Procedure Laterality Date  . COLONOSCOPY  last one 09-29-2012  . INGUINAL HERNIA REPAIR Right 2000  . INGUINAL HERNIA REPAIR Right 04/15/2015   Procedure: LAPAROSCOPIC RIGHT  INGUINAL HERNIA;  Surgeon: Arta Bruce Kinsinger, MD;  Location: [facility];  Service: General;  Laterality: Right;  . INSERTION OF MESH Right 04/15/2015   Procedure: INSERTION OF MESH;  Surgeon: Arta Bruce Kinsinger, MD;  Location: [facility];  Service: General;  Laterality: Right;     Family History: Family History  Problem Relation Age of Onset  . Cancer Father        leukemia  . Heart disease Father   . Parkinson's disease Mother   . Colon cancer Neg Hx   . Esophageal cancer Neg Hx   . Stomach cancer Neg Hx   . Alcohol abuse Neg Hx   . Diabetes Neg Hx   . Early death Neg Hx   . Hyperlipidemia Neg Hx   . Hypertension Neg Hx   . Stroke Neg Hx    No known fam Hx IBD.  Social History: Social History   Socioeconomic History  . Marital status: Divorced    Spouse name: Not on file  . Number of children: 1  . Years of education: Not on file  . Highest education level: Not on file  Occupational History  . Occupation: CFO  Social Needs  . Financial resource strain: Not on file  . Food insecurity:    Worry: Not on file    Inability: Not on file  . Transportation needs:    Medical: Not on file    Non-medical: Not on file  Tobacco Use  . Smoking status: Never Smoker  .  Smokeless tobacco: Never Used  Substance and Sexual Activity  . Alcohol use: Yes    Comment: OCCASIONAL  . Drug use: No  . Sexual activity: Not on file  Lifestyle  . Physical activity:    Days per week: Not on file    Minutes per session: Not on file  . Stress: Not on file  Relationships  . Social connections:    Talks on phone: Not on file    Gets together: Not on file    Attends religious service: Not on file    Active member of club or organization: Not on file    Attends meetings of clubs or organizations: Not on file    Relationship status: Not on file  Other Topics Concern  . Not on file  Social History Narrative  . Not on file    Allergies: No Known Allergies  Outpatient Meds: Current Outpatient Medications  Medication Sig Dispense Refill  . aspirin EC 81 MG tablet Take by mouth.    . atorvastatin (LIPITOR) 10 MG tablet TAKE 1 TABLET(10 MG) BY MOUTH DAILY 90 tablet 1   No current facility-administered medications for this visit.       ___________________________________________________________________ Objective    Labs:  CBC Latest Ref Rng & Units 03/02/2018 04/20/2017 04/15/2016  WBC 4.0 - 10.5 K/uL 10.8(H) 7.6 7.4  Hemoglobin 13.0 - 17.0 g/dL 15.1 16.2 15.6  Hematocrit 39.0 - 52.0 % 44.6 46.2 46.6  Platelets 150.0 - 400.0 K/uL 263.0 211.0 243.0   ESR 31 on 03/02/18  CMP Latest Ref Rng & Units 03/02/2018 04/20/2017 04/15/2016  Glucose 70 - 99 mg/dL 130(H) 94 81  BUN 6 - 23 mg/dL 15 14 13  Creatinine 0.40 - 1.50 mg/dL 1.00 1.06 0.97  Sodium 135 - 145 mEq/L 141 139 140  Potassium 3.5 - 5.1 mEq/L 3.9 4.6 4.2  Chloride 96 - 112 mEq/L 103 103 104  CO2 19 - 32 mEq/L 31 29 31  Calcium 8.4 - 10.5 mg/dL 9.1 9.3 9.5  Total Protein 6.0 - 8.3 g/dL 7.4 7.1 7.0  Total Bilirubin 0.2 - 1.2 mg/dL 0.5 0.8 0.5  Alkaline Phos 39 - 117 U/L 74 59 64  AST 0 - 37 U/L 17 24 19  ALT 0 - 53 U/L 21 27 19     Radiologic Studies:  03/02/18:  CLINICAL DATA:  Abdominal pain    EXAM: CT ABDOMEN AND PELVIS WITH CONTRAST   TECHNIQUE: Multidetector CT imaging of the abdomen and pelvis was performed using the standard protocol following bolus administration of intravenous contrast.   CONTRAST:  100mL ISOVUE-300 IOPAMIDOL (ISOVUE-300) INJECTION 61%   COMPARISON:  June 14, 2016   FINDINGS: Lower chest: There is bibasilar atelectasis. There is no lung base edema or consolidation.   Hepatobiliary: No focal liver lesions are appreciable. Gallbladder wall is not appreciably thickened. There is   no biliary duct dilatation.   Pancreas: There is no pancreatic mass or inflammatory focus.   Spleen: Spleen measures 13.3 x 6.7 x 11.7 cm with a measured splenic diameter of 521 cubic cm. No focal splenic lesions are evident.   Adrenals/Urinary Tract: Adrenals bilaterally appear normal. There is a cyst arising from the medial upper pole of the right kidney measuring 1 x 1 cm.   There is no appreciable hydronephrosis on either side. There is no renal or ureteral calculus on either side. Urinary bladder is midline with wall thickness within normal limits.   Stomach/Bowel: There are multiple diverticula throughout the descending colon and sigmoid regions. No evident diverticulitis. Elsewhere, there is no appreciable bowel wall or mesenteric thickening. No evident bowel obstruction. No free air or portal venous air.   Vascular/Lymphatic: There is aortic atherosclerosis. No aneurysm evident. Major mesenteric arterial vessels are patent. There is no adenopathy in the abdomen or pelvis.   Reproductive:: Prostate and seminal vesicles appear normal in size and contour. No evident pelvic mass.   Other:: Appendix appears unremarkable. No abscess or ascites is evident in the abdomen or pelvis.   Musculoskeletal: There are no blastic or lytic bone lesions. There is no intramuscular or abdominal wall lesion.   IMPRESSION: 1. Multiple descending colonic and sigmoid colonic  diverticula without diverticulitis. No bowel obstruction. No abscess in the abdomen or pelvis. Appendix appears normal.   2. No renal or ureteral calculi. No hydronephrosis. Urinary bladder wall thickness is within normal limits.   3.  Prominent spleen without focal splenic lesion evident.   4.  Aortic atherosclerosis.     Electronically Signed   By: William  Woodruff Allen M.D.   On: 03/02/2018 14:13  I personally reviewed the CT images.  Assessment: Encounter Diagnoses  Name Primary?  . Lower abdominal pain Yes  . Crohn's colitis, without complications (HCC)     His lower abdominal pain of last month has resolved, and was of unclear cause.  He had not been experiencing it chronically. The good news is that his colitis is clinically inactive off medicine for years.  It is not known at this point exactly when he stopped the medicine.  It may be that his Crohn's has "burned out".  Nevertheless, there is the possibility of low-grade smoldering inflammation.  Even if not, he is at increased risk of colon cancer due to his history of Crohn's disease that was active for years requiring medicines.  Daniel Allen himself was concerned because he recalled Dr. Kaplan saying he could have his next colonoscopy at a 10-year interval.  I feel he should have it done this year, and he is in agreement.  We will plan tentatively to do so in June of this year, when we are hopefully passed COVID-19 related restrictions on routine procedures.  He will contact me in the meantime with any problems.  Plan:  Our staff will contact him soon to set up an outpatient routine colonoscopy in June of this year for a history of Crohn's colitis.  Thank you for the courtesy of this consult.  Please call me with any questions or concerns.  Daniel Allen  CC: Referring provider noted above  

## 2018-05-08 ENCOUNTER — Other Ambulatory Visit: Payer: Self-pay | Admitting: Internal Medicine

## 2018-05-08 DIAGNOSIS — E785 Hyperlipidemia, unspecified: Secondary | ICD-10-CM

## 2018-05-22 ENCOUNTER — Ambulatory Visit (INDEPENDENT_AMBULATORY_CARE_PROVIDER_SITE_OTHER): Payer: BLUE CROSS/BLUE SHIELD | Admitting: Internal Medicine

## 2018-05-22 ENCOUNTER — Other Ambulatory Visit (INDEPENDENT_AMBULATORY_CARE_PROVIDER_SITE_OTHER): Payer: BLUE CROSS/BLUE SHIELD

## 2018-05-22 ENCOUNTER — Encounter: Payer: Self-pay | Admitting: Internal Medicine

## 2018-05-22 ENCOUNTER — Other Ambulatory Visit: Payer: Self-pay

## 2018-05-22 VITALS — BP 126/84 | HR 80 | Temp 98.2°F | Resp 16 | Ht 73.0 in | Wt 182.5 lb

## 2018-05-22 DIAGNOSIS — Z0000 Encounter for general adult medical examination without abnormal findings: Secondary | ICD-10-CM

## 2018-05-22 DIAGNOSIS — K219 Gastro-esophageal reflux disease without esophagitis: Secondary | ICD-10-CM

## 2018-05-22 DIAGNOSIS — E785 Hyperlipidemia, unspecified: Secondary | ICD-10-CM | POA: Diagnosis not present

## 2018-05-22 LAB — LIPID PANEL
Cholesterol: 153 mg/dL (ref 0–200)
HDL: 43.9 mg/dL (ref >39.00)
LDL Cholesterol: 86 mg/dL (ref 0–99)
NonHDL: 108.75
Total CHOL/HDL Ratio: 3
Triglycerides: 114 mg/dL (ref 0.0–149.0)
VLDL: 22.8 mg/dL (ref 0.0–40.0)

## 2018-05-22 LAB — CBC WITH DIFFERENTIAL/PLATELET
Basophils Absolute: 0.1 10*3/uL (ref 0.0–0.1)
Basophils Relative: 0.6 % (ref 0.0–3.0)
Eosinophils Absolute: 0.2 10*3/uL (ref 0.0–0.7)
Eosinophils Relative: 1.7 % (ref 0.0–5.0)
HCT: 46 % (ref 39.0–52.0)
Hemoglobin: 15.8 g/dL (ref 13.0–17.0)
Lymphocytes Relative: 15.7 % (ref 12.0–46.0)
Lymphs Abs: 1.6 10*3/uL (ref 0.7–4.0)
MCHC: 34.3 g/dL (ref 30.0–36.0)
MCV: 90.2 fl (ref 78.0–100.0)
Monocytes Absolute: 0.6 10*3/uL (ref 0.1–1.0)
Monocytes Relative: 5.7 % (ref 3.0–12.0)
Neutro Abs: 7.8 10*3/uL — ABNORMAL HIGH (ref 1.4–7.7)
Neutrophils Relative %: 76.3 % (ref 43.0–77.0)
Platelets: 250 10*3/uL (ref 150.0–400.0)
RBC: 5.09 Mil/uL (ref 4.22–5.81)
RDW: 13.7 % (ref 11.5–15.5)
WBC: 10.3 10*3/uL (ref 4.0–10.5)

## 2018-05-22 LAB — TSH: TSH: 1.88 u[IU]/mL (ref 0.35–4.50)

## 2018-05-22 LAB — PSA: PSA: 2.59 ng/mL (ref 0.10–4.00)

## 2018-05-22 NOTE — Patient Instructions (Signed)

## 2018-05-22 NOTE — Progress Notes (Signed)
Subjective:  Patient ID: Daniel Allen, male    DOB: [birth year]  Age: 58 y.o. MRN: [id number]  CC: Annual Exam and Hyperlipidemia   HPI Daniel Allen presents for a CPX.  He feels well and offers no complaints.  Outpatient Medications Prior to Visit  Medication Sig Dispense Refill  . aspirin EC 81 MG tablet Take by mouth.    Marland Kitchen atorvastatin (LIPITOR) 10 MG tablet TAKE 1 TABLET(10 MG) BY MOUTH DAILY 90 tablet 0   No facility-administered medications prior to visit.     ROS Review of Systems  Constitutional: Negative.  Negative for diaphoresis and fatigue.  HENT: Negative.   Eyes: Negative.   Respiratory: Negative for cough, chest tightness, shortness of breath and wheezing.   Cardiovascular: Negative for chest pain, palpitations and leg swelling.  Gastrointestinal: Negative for abdominal pain, constipation, diarrhea, nausea and vomiting.  Endocrine: Negative.   Genitourinary: Negative.  Negative for difficulty urinating, penile swelling, scrotal swelling and testicular pain.  Musculoskeletal: Negative.  Negative for arthralgias and myalgias.  Skin: Negative.  Negative for color change.  Neurological: Negative.  Negative for dizziness.  Hematological: Negative for adenopathy. Does not bruise/bleed easily.  Psychiatric/Behavioral: Negative.     Objective:  BP 126/84 (BP Location: Left Arm, Patient Position: Sitting, Cuff Size: Normal)   Pulse 80   Temp 98.2 F (36.8 C) (Oral)   Resp 16   Ht 6' 1"  (1.854 m)   Wt 182 lb 8 oz (82.8 kg)   SpO2 99%   BMI 24.08 kg/m   BP Readings from Last 3 Encounters:  05/22/18 126/84  03/02/18 128/80  02/08/18 140/80    Wt Readings from Last 3 Encounters:  05/22/18 182 lb 8 oz (82.8 kg)  03/02/18 188 lb 12 oz (85.6 kg)  02/08/18 192 lb 0.6 oz (87.1 kg)    Physical Exam Vitals signs reviewed.  HENT:     Nose: Nose normal.     Mouth/Throat:     Mouth: Mucous membranes are moist.     Pharynx: No oropharyngeal exudate.   Eyes:     Conjunctiva/sclera: Conjunctivae normal.  Neck:     Musculoskeletal: Normal range of motion and neck supple. No muscular tenderness.  Cardiovascular:     Rate and Rhythm: Normal rate and regular rhythm.     Heart sounds: No murmur. No gallop.   Pulmonary:     Effort: Pulmonary effort is normal.     Breath sounds: No stridor. No wheezing, rhonchi or rales.  Abdominal:     General: Bowel sounds are normal.     Palpations: There is no mass.     Tenderness: There is no abdominal tenderness. There is no guarding.  Genitourinary:    Comments: GU exam was deferred since this was just done 3 months ago Musculoskeletal: Normal range of motion.     Right lower leg: No edema.     Left lower leg: No edema.  Lymphadenopathy:     Cervical: No cervical adenopathy.  Skin:    General: Skin is warm and dry.  Neurological:     General: No focal deficit present.  Psychiatric:        Mood and Affect: Mood normal.        Behavior: Behavior normal.     Lab Results  Component Value Date   WBC 10.3 05/22/2018   HGB 15.8 05/22/2018   HCT 46.0 05/22/2018   PLT 250.0 05/22/2018   GLUCOSE 130 (H) 03/02/2018   CHOL  153 05/22/2018   TRIG 114.0 05/22/2018   HDL 43.90 05/22/2018   LDLDIRECT 162.9 09/14/2013   LDLCALC 86 05/22/2018   ALT 21 03/02/2018   AST 17 03/02/2018   NA 141 03/02/2018   K 3.9 03/02/2018   CL 103 03/02/2018   CREATININE 1.00 03/02/2018   BUN 15 03/02/2018   CO2 31 03/02/2018   TSH 1.88 05/22/2018   PSA 2.59 05/22/2018    Ct Abdomen Pelvis W Contrast  Result Date: 03/02/2018 CLINICAL DATA:  Abdominal pain EXAM: CT ABDOMEN AND PELVIS WITH CONTRAST TECHNIQUE: Multidetector CT imaging of the abdomen and pelvis was performed using the standard protocol following bolus administration of intravenous contrast. CONTRAST:  139m ISOVUE-300 IOPAMIDOL (ISOVUE-300) INJECTION 61% COMPARISON:  June 14, 2016 FINDINGS: Lower chest: There is bibasilar atelectasis. There is no  lung base edema or consolidation. Hepatobiliary: No focal liver lesions are appreciable. Gallbladder wall is not appreciably thickened. There is no biliary duct dilatation. Pancreas: There is no pancreatic mass or inflammatory focus. Spleen: Spleen measures 13.3 x 6.7 x 11.7 cm with a measured splenic diameter of 521 cubic cm. No focal splenic lesions are evident. Adrenals/Urinary Tract: Adrenals bilaterally appear normal. There is a cyst arising from the medial upper pole of the right kidney measuring 1 x 1 cm. There is no appreciable hydronephrosis on either side. There is no renal or ureteral calculus on either side. Urinary bladder is midline with wall thickness within normal limits. Stomach/Bowel: There are multiple diverticula throughout the descending colon and sigmoid regions. No evident diverticulitis. Elsewhere, there is no appreciable bowel wall or mesenteric thickening. No evident bowel obstruction. No free air or portal venous air. Vascular/Lymphatic: There is aortic atherosclerosis. No aneurysm evident. Major mesenteric arterial vessels are patent. There is no adenopathy in the abdomen or pelvis. Reproductive:: Prostate and seminal vesicles appear normal in size and contour. No evident pelvic mass. Other:: Appendix appears unremarkable. No abscess or ascites is evident in the abdomen or pelvis. Musculoskeletal: There are no blastic or lytic bone lesions. There is no intramuscular or abdominal wall lesion. IMPRESSION: 1. Multiple descending colonic and sigmoid colonic diverticula without diverticulitis. No bowel obstruction. No abscess in the abdomen or pelvis. Appendix appears normal. 2. No renal or ureteral calculi. No hydronephrosis. Urinary bladder wall thickness is within normal limits. 3.  Prominent spleen without focal splenic lesion evident. 4.  Aortic atherosclerosis. Electronically Signed   By: WLowella GripIII M.D.   On: 03/02/2018 14:13   Dg Abd Acute 2+v W 1v Chest  Result Date:  03/02/2018 CLINICAL DATA:  Acute periumbilical abdominal pain. EXAM: DG ABDOMEN ACUTE W/ 1V CHEST COMPARISON:  None. FINDINGS: There is no evidence of dilated bowel loops or free intraperitoneal air. No radiopaque calculi or other significant radiographic abnormality is seen. Heart size and mediastinal contours are within normal limits. Both lungs are clear. IMPRESSION: No evidence of bowel obstruction or ileus. No acute cardiopulmonary disease. Electronically Signed   By: JMarijo Conception M.D.   On: 03/02/2018 09:14    Assessment & Plan:   BHakimwas seen today for annual exam and hyperlipidemia.  Diagnoses and all orders for this visit:  Routine general medical examination at a health care facility- Exam completed, labs reviewed, vaccines reviewed and updated, screening for colon cancer is up-to-date, patient education material was given. -     Lipid panel; Future -     HIV Antibody (routine testing w rflx); Future -     PSA; Future  Hyperlipidemia LDL goal <100- He has achieved his LDL goal and is doing well on the statin. -     TSH; Future  Gastroesophageal reflux disease without esophagitis- He is asymptomatic with this.  Medical therapy is not indicated. -     CBC with Differential/Platelet; Future   I am having Daniel Allen maintain his aspirin EC and atorvastatin.  No orders of the defined types were placed in this encounter.    Follow-up: Return in about 1 year (around 05/22/2019).  Scarlette Calico, MD

## 2018-05-23 LAB — HIV ANTIBODY (ROUTINE TESTING W REFLEX): HIV 1&2 Ab, 4th Generation: NONREACTIVE

## 2018-06-20 ENCOUNTER — Encounter: Payer: Self-pay | Admitting: Gastroenterology

## 2018-06-28 ENCOUNTER — Encounter: Payer: Self-pay | Admitting: Gastroenterology

## 2018-07-21 ENCOUNTER — Ambulatory Visit (AMBULATORY_SURGERY_CENTER): Payer: Self-pay | Admitting: *Deleted

## 2018-07-21 ENCOUNTER — Other Ambulatory Visit: Payer: Self-pay

## 2018-07-21 VITALS — Ht 73.0 in | Wt 182.0 lb

## 2018-07-21 DIAGNOSIS — K5010 Crohn's disease of large intestine without complications: Secondary | ICD-10-CM

## 2018-07-21 MED ORDER — PLENVU 140 G PO SOLR: 1.0000 | Freq: Once | ORAL | 0 refills | Status: AC

## 2018-07-21 NOTE — Progress Notes (Signed)
Patient's pre-visit was done today over the phone with the patient due to COVID-19 pandemic. Name,DOB and address verified. Insurance verified. Packet of Prep instructions mailed to patient including copy of a consent form and pre-procedure patient acknowledgement form-pt is aware. [employer]  Coupon included. Patient understands to call us back with any questions or concerns. Patient denies any allergies to eggs or soy. Patient denies any problems with anesthesia/sedation. Patient denies any oxygen use at home. Patient denies taking any diet/weight loss medications or blood thinners. EMMI education assisgned to patient on colonoscopy, this was explained and instructions given to patient. Pt is aware that care partner will wait in the car during procedure; if they feel like they will be too hot to wait in the car; they may wait in the lobby.  We want them to wear a mask (we do not have any that we can provide them), practice social distancing, and we will check their temperatures when they get here.  I did remind patient that their care partner needs to stay in the parking lot the entire time. Pt will wear mask into building.

## 2018-08-08 ENCOUNTER — Other Ambulatory Visit: Payer: Self-pay | Admitting: Internal Medicine

## 2018-08-08 DIAGNOSIS — E785 Hyperlipidemia, unspecified: Secondary | ICD-10-CM

## 2018-08-08 MED ORDER — ATORVASTATIN CALCIUM 10 MG PO TABS: 10.0000 mg | ORAL_TABLET | Freq: Every day | ORAL | 1 refills | Status: DC

## 2018-08-09 LAB — HM COLONOSCOPY

## 2018-08-10 ENCOUNTER — Telehealth: Payer: Self-pay | Admitting: Gastroenterology

## 2018-08-10 NOTE — Telephone Encounter (Signed)
Spoke with patient regarding Covid-19 screening questions. Covid-19 Screening Questions:   Do you now or have you had a fever in the last 14 days? no   Do you have any respiratory symptoms of shortness of breath or cough now or in the last 14 days? no   Do you have any family members or close contacts with diagnosed or suspected Covid-19 in the past 14 days? no   Have you been tested for Covid-19 and found to be positive? Yes, Negative in April   Pt made aware of that care partner may wait in the car or come up to the lobby during the procedure but will need to provide their own mask

## 2018-08-11 ENCOUNTER — Encounter: Payer: Self-pay | Admitting: Gastroenterology

## 2018-08-11 ENCOUNTER — Ambulatory Visit (AMBULATORY_SURGERY_CENTER): Payer: BC Managed Care – PPO | Admitting: Gastroenterology

## 2018-08-11 ENCOUNTER — Other Ambulatory Visit: Payer: Self-pay

## 2018-08-11 VITALS — BP 129/65 | HR 56 | Temp 98.6°F | Resp 15 | Ht 73.0 in | Wt 182.0 lb

## 2018-08-11 DIAGNOSIS — Z1211 Encounter for screening for malignant neoplasm of colon: Secondary | ICD-10-CM | POA: Diagnosis not present

## 2018-08-11 DIAGNOSIS — K5730 Diverticulosis of large intestine without perforation or abscess without bleeding: Secondary | ICD-10-CM | POA: Diagnosis not present

## 2018-08-11 DIAGNOSIS — K50119 Crohn's disease of large intestine with unspecified complications: Secondary | ICD-10-CM | POA: Diagnosis not present

## 2018-08-11 DIAGNOSIS — K529 Noninfective gastroenteritis and colitis, unspecified: Secondary | ICD-10-CM | POA: Diagnosis not present

## 2018-08-11 DIAGNOSIS — K5010 Crohn's disease of large intestine without complications: Secondary | ICD-10-CM | POA: Diagnosis not present

## 2018-08-11 MED ORDER — SODIUM CHLORIDE 0.9 % IV SOLN: 500.0000 mL | Freq: Once | INTRAVENOUS | Status: DC

## 2018-08-11 NOTE — Patient Instructions (Signed)
Discharge instructions given. Handout on Diverticulosis. Biopsies taken. Resume previous medications. YOU HAD AN ENDOSCOPIC PROCEDURE TODAY AT [facility]:   Refer to the procedure report that was given to you for any specific questions about what was found during the examination.  If the procedure report does not answer your questions, please call your gastroenterologist to clarify.  If you requested that your care partner not be given the details of your procedure findings, then the procedure report has been included in a sealed envelope for you to review at your convenience later.  YOU SHOULD EXPECT: Some feelings of bloating in the abdomen. Passage of more gas than usual.  Walking can help get rid of the air that was put into your GI tract during the procedure and reduce the bloating. If you had a lower endoscopy (such as a colonoscopy or flexible sigmoidoscopy) you may notice spotting of blood in your stool or on the toilet paper. If you underwent a bowel prep for your procedure, you may not have a normal bowel movement for a few days.  Please Note:  You might notice some irritation and congestion in your nose or some drainage.  This is from the oxygen used during your procedure.  There is no need for concern and it should clear up in a day or so.  SYMPTOMS TO REPORT IMMEDIATELY:   Following lower endoscopy (colonoscopy or flexible sigmoidoscopy):  Excessive amounts of blood in the stool  Significant tenderness or worsening of abdominal pains  Swelling of the abdomen that is new, acute  Fever of 100F or higher  For urgent or emergent issues, a gastroenterologist can be reached at any hour by calling [phone].   DIET:  We do recommend a small meal at first, but then you may proceed to your regular diet.  Drink plenty of fluids but you should avoid alcoholic beverages for 24 hours.  ACTIVITY:  You should plan to take it easy for the rest of today and you should NOT  DRIVE or use heavy machinery until tomorrow (because of the sedation medicines used during the test).    FOLLOW UP: Our staff will call the number listed on your records 48-72 hours following your procedure to check on you and address any questions or concerns that you may have regarding the information given to you following your procedure. If we do not reach you, we will leave a message.  We will attempt to reach you two times.  During this call, we will ask if you have developed any symptoms of COVID 19. If you develop any symptoms (ie: fever, flu-like symptoms, shortness of breath, cough etc.) before then, please call [phone].  If you test positive for Covid 19 in the 2 weeks post procedure, please call and report this information to [place].    If any biopsies were taken you will be contacted by phone or by letter within the next 1-3 weeks.  Please call us at [phone] if you have not heard about the biopsies in 3 weeks.    SIGNATURES/CONFIDENTIALITY: You and/or your care partner have signed paperwork which will be entered into your electronic medical record.  These signatures attest to the fact that that the information above on your After Visit Summary has been reviewed and is understood.  Full responsibility of the confidentiality of this discharge information lies with you and/or your care-partner.

## 2018-08-11 NOTE — Progress Notes (Signed)
Called to room to assist during endoscopic procedure.  Patient ID and intended procedure confirmed with present staff. Received instructions for my participation in the procedure from the performing physician.  

## 2018-08-11 NOTE — Progress Notes (Signed)
Report given to PACU, vss 

## 2018-08-11 NOTE — Progress Notes (Signed)
Pt's states no medical or surgical changes since previsit or office visit.  Temp taken by JB VS taken by MO

## 2018-08-11 NOTE — Op Note (Signed)
[facility] Patient Name: Daniel Allen Procedure Date: 08/11/2018 7:26 AM MRN: [id number] Endoscopist: Mallie Mussel L. Loletha Carrow , MD Age: 58 Referring MD:  Date of Birth: 12-20-1960 Gender: Male Account #: [id number] Procedure:                Colonoscopy Indications:              Crohn's colitis of 8 (or more) years duration with                            one-third (or more) of the colon involved (not                            active when last evaluated by colonoscopy 2014, no                            meds since then) Medicines:                Monitored Anesthesia Care Procedure:                Pre-Anesthesia Assessment:                           - Prior to the procedure, a History and Physical                            was performed, and patient medications and                            allergies were reviewed. The patient's tolerance of                            previous anesthesia was also reviewed. The risks                            and benefits of the procedure and the sedation                            options and risks were discussed with the patient.                            All questions were answered, and informed consent                            was obtained. Prior Anticoagulants: The patient has                            taken no previous anticoagulant or antiplatelet                            agents except for aspirin. ASA Grade Assessment: II                            - A patient with mild systemic disease. After  reviewing the risks and benefits, the patient was                            deemed in satisfactory condition to undergo the                            procedure.                           After obtaining informed consent, the colonoscope                            was passed under direct vision. Throughout the                            procedure, the patient's blood pressure, pulse, and   oxygen saturations were monitored continuously. The                            Colonoscope was introduced through the anus and                            advanced to the the terminal ileum, with                            identification of the appendiceal orifice and IC                            valve. The colonoscopy was performed without                            difficulty. The patient tolerated the procedure                            well. The quality of the bowel preparation was                            fair. The terminal ileum, ileocecal valve,                            appendiceal orifice, and rectum were photographed. Scope In: 7:38:26 AM Scope Out: 1:61:09 AM Scope Withdrawal Time: 0 hours 12 minutes 49 seconds  Total Procedure Duration: 0 hours 17 minutes 47 seconds  Findings:                 The perianal and digital rectal examinations were                            normal. Specifically, no peri-anal Crohn's disease                            activity.                           The terminal ileum appeared normal.  Inflammation characterized by congestion (edema),                            erythema, granularity and loss of vascularity was                            found in a continuous and circumferential pattern                            from the descending colon to the cecum.Somewhat                            patchy in distal descending and proximal sigmoid                            colon. The rectum, the mid sigmoid colon and the                            distal sigmoid colon were spared. This was graded                            as Mayo Score 2 (moderate, with marked erythema,                            absent vascular pattern, friability, erosions).                            Biopsies were taken with a cold forceps for                            histology from ascending and sigmoid (visibly                            involved areas).                            Diverticula were found in the sigmoid colon.                           The exam was otherwise without abnormality on                            direct and retroflexion views. Complications:            No immediate complications. Estimated Blood Loss:     Estimated blood loss was minimal. Impression:               - Preparation of the colon was fair.                           - Pancolitis. Inflammation was found from the                            descending colon to the cecum. This was graded as  Mayo Score 2 (moderate disease). Biopsied.                           - Diverticulosis in the sigmoid colon.                           - The examination was otherwise normal on direct                            and retroflexion views. Recommendation:           - Patient has a contact number available for                            emergencies. The signs and symptoms of potential                            delayed complications were discussed with the                            patient. Return to normal activities tomorrow.                            Written discharge instructions were provided to the                            patient.                           - Resume previous diet.                           - Continue present medications.                           - Await pathology results, then discuss colitis                            treatment.                           - Repeat colonoscopy is recommended for                            surveillance. The colonoscopy date will be                            determined after pathology results from today's                            exam become available for review. Henry L. Loletha Carrow, MD 08/11/2018 8:10:31 AM This report has been signed electronically.

## 2018-08-16 ENCOUNTER — Telehealth: Payer: Self-pay | Admitting: *Deleted

## 2018-08-16 NOTE — Telephone Encounter (Signed)
No answer for post procedure call back. Left message for patient to call with questions or concerns. SM

## 2018-08-18 ENCOUNTER — Telehealth: Payer: Self-pay | Admitting: Gastroenterology

## 2018-08-18 NOTE — Telephone Encounter (Signed)
Patient called on call to write Rx for Asacol. Recent colonsocopy with active colonic Crohns Disease, with plan to restart on Asacol. Has taken in the past without issue and requesting to restart now as he had discussed with Dr. Loletha Carrow previously. However, his pharmacy no longer carrying Asacol. Discussed changing to alternative agent. I discussed with the Pharmacist and they carry mesalamine (generic Apriso), but unfortunately with only 10 available. Will have to give those 10 now, then he can return to the pharmacy Monday when they have more in stock. Will give Rx for mesalamine 0.375 mg. Sig: Take 4 caps daily. #120. RF5. Called patient back to ensure he is ok with change in 5-ASA formulation. Left VM on multiple attempts to recontact him.

## 2018-08-21 NOTE — Telephone Encounter (Signed)
Patient called today after picking up his Rx that Dr Bryan Lemma had sent to the pharmacy. I explained to him that it was changed to the Apriso and that Dr Bryan Lemma had tried to make contact with him after talking to the pharmacy. The patient wants you to be aware and get your option and thoughts on this change in medications.

## 2018-08-22 NOTE — Telephone Encounter (Signed)
Patient notified and aware. He wants to take Asacol vs the Apriso that he has not used before. He is going to find a pharmacy that has it in [place] so we can resend the Rx for Asacol.

## 2018-08-22 NOTE — Telephone Encounter (Signed)
Yes, thank you.  This is a good substitution.

## 2018-09-05 ENCOUNTER — Encounter: Payer: Self-pay | Admitting: *Deleted

## 2018-09-11 DIAGNOSIS — H524 Presbyopia: Secondary | ICD-10-CM | POA: Diagnosis not present

## 2018-09-11 DIAGNOSIS — H52223 Regular astigmatism, bilateral: Secondary | ICD-10-CM | POA: Diagnosis not present

## 2018-09-11 DIAGNOSIS — H5203 Hypermetropia, bilateral: Secondary | ICD-10-CM | POA: Diagnosis not present

## 2018-09-27 DIAGNOSIS — Z23 Encounter for immunization: Secondary | ICD-10-CM | POA: Diagnosis not present

## 2018-10-13 ENCOUNTER — Ambulatory Visit: Payer: BC Managed Care – PPO | Admitting: Gastroenterology

## 2018-10-13 ENCOUNTER — Other Ambulatory Visit: Payer: Self-pay

## 2018-10-13 ENCOUNTER — Encounter: Payer: Self-pay | Admitting: Gastroenterology

## 2018-10-13 VITALS — BP 110/70 | HR 83 | Temp 98.4°F | Ht 73.0 in | Wt 192.0 lb

## 2018-10-13 DIAGNOSIS — K5010 Crohn's disease of large intestine without complications: Secondary | ICD-10-CM | POA: Diagnosis not present

## 2018-10-13 MED ORDER — MESALAMINE 800 MG PO TBEC: 1600.0000 mg | DELAYED_RELEASE_TABLET | Freq: Two times a day (BID) | ORAL | 11 refills | Status: DC

## 2018-10-13 NOTE — Patient Instructions (Addendum)
If you are age 58 or older, your body mass index should be between 23-30. Your Body mass index is 25.33 kg/m. If this is out of the aforementioned range listed, please consider follow up with your Primary Care Provider.  If you are age 46 or younger, your body mass index should be between 19-25. Your Body mass index is 25.33 kg/m. If this is out of the aformentioned range listed, please consider follow up with your Primary Care Provider.   It was a pleasure to see you today!  Dr. Loletha Carrow

## 2018-10-13 NOTE — Progress Notes (Signed)
Daniel Allen GI Progress Note  Chief Complaint: Crohn's colitis  Subjective  History: Daniel Allen was a new patient to me by telemedicine in April of this year, having previously seen Dr. Deatra Allen.  Records indicate that in 2012 Dr. Deatra Allen reported the patient had had 15 years of Crohn's colitis under control with Asacol.  Colonoscopy November 2012 had a sending colon inflammation with ulcers.  The biopsy showed active IBD without dysplasia.  That diagnosis had initially been made in [place].  Colonoscopy with Dr. Deatra Allen September 2014 complete to the terminal ileum was normal except for mildly erythematous mucosa in the distal rectum.  Random biopsies from the colon and rectum were normal.  Somewhere along the way Daniel Allen stopped taking Asacol.  Colonoscopy by me on 08/11/2018 showed active colitis from the cecum to the proximal sigmoid.  It was continuous and circumferential until the descending, where it then became somewhat patchy until the proximal sigmoid and then spared the colon from the mid sigmoid through the rectum. Once biopsies were resulted as below, I prescribed Asacol, with plans for a repeat colonoscopy in 1 year, at which time he will need extensive surveillance biopsies.  There was a phone call in early August that the Asacol was not available by his pharmacy, covering physician prescribed Apriso.  It is unclear whether his pharmacy just did not stop the medicine, or if there was an insurance coverage issue.  The last message we got from him was that he decided to take the Asacol, but he tells me today that in fact he took the [facility].  However, he had to stop after several days because it caused abdominal cramps and diarrhea.  He is back to feeling well as before, with no chronic abdominal pain diarrhea or rectal bleeding.  ROS: Cardiovascular:  no chest pain Respiratory: no dyspnea  The patient's Past Medical, Family and Social History were reviewed and are on file in the  EMR.  Objective:  Med list reviewed  Current Outpatient Medications:  .  aspirin EC 81 MG tablet, Take by mouth., Disp: , Rfl:  .  atorvastatin (LIPITOR) 10 MG tablet, Take 1 tablet (10 mg total) by mouth daily at 6 PM., Disp: 90 tablet, Rfl: 1 .  Multiple Vitamin (ONE-A-DAY MENS PO), Take by mouth., Disp: , Rfl:  .  Mesalamine 800 MG TBEC, Take 2 tablets (1,600 mg total) by mouth 2 (two) times daily., Disp: 120 tablet, Rfl: 11   Vital signs in last 24 hrs: Vitals:   10/13/18 0851  BP: 110/70  Pulse: 83  Temp: 98.4 F (36.9 C)    Physical Exam  He is well-appearing  HEENT: sclera anicteric, oral mucosa moist without lesions  Neck: supple, no thyromegaly, JVD or lymphadenopathy  Cardiac: RRR without murmurs, S1S2 heard, no peripheral edema  Pulm: clear to auscultation bilaterally, normal RR and effort noted  Abdomen: soft, no tenderness, with active bowel sounds. No guarding or palpable hepatosplenomegaly.  Skin; warm and dry, no jaundice or rash  Recent Labs: Recent colon biopsy results:  Surgical [P], colon, ascending - MODERATELY ACTIVE CHRONIC COLITIS WITH GRANULOMATOUS INFLAMMATION, CONSISTENT WITH CROHNS DISEASE. - THERE IS NO EVIDENCE OF DYSPLASIA OR MALIGNANCY. - SEE COMMENT. 2. Surgical [P], colon, sigmoid - MINIMALLY ACTIVE CHRONIC COLITIS WITH VAGUE GRANULOMATOUS INFLAMMATION. - THERE IS NO EVIDENCE OF DYSPLASIA OR MALIGNANCY. - SEE COMMENT.    CMP Latest Ref Rng & Units 03/02/2018 04/20/2017 04/15/2016  Glucose 70 - 99 mg/dL 130(H) 94 81  BUN 6 - 23 mg/dL 15 14 13   Creatinine 0.40 - 1.50 mg/dL 1.00 1.06 0.97  Sodium 135 - 145 mEq/L 141 139 140  Potassium 3.5 - 5.1 mEq/L 3.9 4.6 4.2  Chloride 96 - 112 mEq/L 103 103 104  CO2 19 - 32 mEq/L 31 29 31   Calcium 8.4 - 10.5 mg/dL 9.1 9.3 9.5  Total Protein 6.0 - 8.3 g/dL 7.4 7.1 7.0  Total Bilirubin 0.2 - 1.2 mg/dL 0.5 0.8 0.5  Alkaline Phos 39 - 117 U/L 74 59 64  AST 0 - 37 U/L 17 24 19   ALT 0 - 53 U/L  21 27 19     @ASSESSMENTPLANBEGIN @ Assessment: Encounter Diagnosis  Name Primary?  . Crohn's disease of colon without complication ([place]) Yes   He is asymptomatic from this colitis, but we discussed the importance of long-term therapy both to decrease chance of severe flare, but also decrease his chance of colorectal cancer.  His last renal function was normal, so we can continue mesalamine. Plan: New prescription sent to his pharmacy for Asacol 1600 mg twice daily. My staff will follow that up with a phone call to his pharmacy later today to see if there is any problem with him obtaining it.  There are other mesalamine preparations if necessary, even though Apriso did not agree with him, he tolerated Asacol well for many years in the past.  If mesalamine is not available it does not agree with him, alternative would be Uceris.  However, that also tends to be costly and has the steroid side effects and risks, albeit much less than prednisone.  Clinic visit in 6 months, colonoscopy in a year.  Sooner as needed.  Total time 25 minutes, over half spent face-to-face with patient in counseling and coordination of care.   Nelida Meuse III

## 2018-10-30 DIAGNOSIS — L821 Other seborrheic keratosis: Secondary | ICD-10-CM | POA: Diagnosis not present

## 2018-10-30 DIAGNOSIS — Z85828 Personal history of other malignant neoplasm of skin: Secondary | ICD-10-CM | POA: Diagnosis not present

## 2018-10-30 DIAGNOSIS — D2261 Melanocytic nevi of right upper limb, including shoulder: Secondary | ICD-10-CM | POA: Diagnosis not present

## 2018-10-30 DIAGNOSIS — D225 Melanocytic nevi of trunk: Secondary | ICD-10-CM | POA: Diagnosis not present

## 2018-10-30 DIAGNOSIS — L570 Actinic keratosis: Secondary | ICD-10-CM | POA: Diagnosis not present

## 2018-11-13 ENCOUNTER — Encounter: Payer: Self-pay | Admitting: Internal Medicine

## 2018-11-23 ENCOUNTER — Other Ambulatory Visit: Payer: Self-pay | Admitting: Internal Medicine

## 2018-11-23 DIAGNOSIS — R972 Elevated prostate specific antigen [PSA]: Secondary | ICD-10-CM | POA: Insufficient documentation

## 2018-11-23 NOTE — Telephone Encounter (Signed)
Do you want to add the PSA order?

## 2018-11-27 DIAGNOSIS — Z20828 Contact with and (suspected) exposure to other viral communicable diseases: Secondary | ICD-10-CM | POA: Diagnosis not present

## 2018-11-30 ENCOUNTER — Other Ambulatory Visit: Payer: BC Managed Care – PPO

## 2018-11-30 DIAGNOSIS — R972 Elevated prostate specific antigen [PSA]: Secondary | ICD-10-CM | POA: Diagnosis not present

## 2018-12-01 LAB — PSA, TOTAL AND FREE
PSA, % Free: 25 % (calc) — ABNORMAL LOW (ref >25)
PSA, Free: 0.4 ng/mL
PSA, Total: 1.6 ng/mL (ref <=4.0)

## 2018-12-01 LAB — PSA: PSA: 1.6

## 2018-12-02 ENCOUNTER — Encounter: Payer: Self-pay | Admitting: Internal Medicine

## 2019-01-12 HISTORY — PX: COLONOSCOPY: SHX174

## 2019-01-22 DIAGNOSIS — Z20828 Contact with and (suspected) exposure to other viral communicable diseases: Secondary | ICD-10-CM | POA: Diagnosis not present

## 2019-02-03 ENCOUNTER — Other Ambulatory Visit: Payer: Self-pay | Admitting: Internal Medicine

## 2019-02-03 DIAGNOSIS — E785 Hyperlipidemia, unspecified: Secondary | ICD-10-CM

## 2019-07-31 ENCOUNTER — Other Ambulatory Visit: Payer: Self-pay | Admitting: Internal Medicine

## 2019-07-31 DIAGNOSIS — E785 Hyperlipidemia, unspecified: Secondary | ICD-10-CM

## 2019-08-01 ENCOUNTER — Ambulatory Visit: Payer: BC Managed Care – PPO | Admitting: Gastroenterology

## 2019-08-01 ENCOUNTER — Ambulatory Visit (INDEPENDENT_AMBULATORY_CARE_PROVIDER_SITE_OTHER): Payer: BC Managed Care – PPO | Admitting: Internal Medicine

## 2019-08-01 ENCOUNTER — Encounter: Payer: Self-pay | Admitting: Gastroenterology

## 2019-08-01 ENCOUNTER — Encounter: Payer: Self-pay | Admitting: Internal Medicine

## 2019-08-01 ENCOUNTER — Other Ambulatory Visit: Payer: Self-pay

## 2019-08-01 VITALS — BP 128/80 | HR 65 | Temp 98.3°F | Resp 16 | Ht 73.0 in | Wt 194.0 lb

## 2019-08-01 VITALS — BP 110/70 | HR 76 | Ht 73.0 in | Wt 194.6 lb

## 2019-08-01 DIAGNOSIS — R739 Hyperglycemia, unspecified: Secondary | ICD-10-CM

## 2019-08-01 DIAGNOSIS — I491 Atrial premature depolarization: Secondary | ICD-10-CM | POA: Diagnosis not present

## 2019-08-01 DIAGNOSIS — K5010 Crohn's disease of large intestine without complications: Secondary | ICD-10-CM | POA: Diagnosis not present

## 2019-08-01 DIAGNOSIS — Z23 Encounter for immunization: Secondary | ICD-10-CM | POA: Diagnosis not present

## 2019-08-01 DIAGNOSIS — Z0000 Encounter for general adult medical examination without abnormal findings: Secondary | ICD-10-CM | POA: Diagnosis not present

## 2019-08-01 DIAGNOSIS — E785 Hyperlipidemia, unspecified: Secondary | ICD-10-CM

## 2019-08-01 DIAGNOSIS — K5090 Crohn's disease, unspecified, without complications: Secondary | ICD-10-CM | POA: Diagnosis not present

## 2019-08-01 MED ORDER — NA SULFATE-K SULFATE-MG SULF 17.5-3.13-1.6 GM/177ML PO SOLN: 1.0000 | Freq: Once | ORAL | 0 refills | Status: AC

## 2019-08-01 MED ORDER — MESALAMINE 800 MG PO TBEC: 1600.0000 mg | DELAYED_RELEASE_TABLET | Freq: Two times a day (BID) | ORAL | 11 refills | Status: DC

## 2019-08-01 NOTE — Patient Instructions (Signed)
If you are age 59 or older, your body mass index should be between 23-30. Your Body mass index is 25.67 kg/m. If this is out of the aforementioned range listed, please consider follow up with your Primary Care Provider.  If you are age 61 or younger, your body mass index should be between 19-25. Your Body mass index is 25.67 kg/m. If this is out of the aformentioned range listed, please consider follow up with your Primary Care Provider.   You have been scheduled for a colonoscopy. Please follow written instructions given to you at your visit today.  Please pick up your prep supplies at the pharmacy within the next 1-3 days. If you use inhalers (even only as needed), please bring them with you on the day of your procedure.   It was a pleasure to see you today!  Dr. Loletha Carrow

## 2019-08-01 NOTE — Patient Instructions (Signed)

## 2019-08-01 NOTE — Progress Notes (Signed)
Subjective:  Patient ID: Daniel Allen, male    DOB: [birth year]  Age: 59 y.o. MRN: [id number]  CC: Annual Exam and Hyperlipidemia  This visit occurred during the SARS-CoV-2 public health emergency.  Safety protocols were in place, including screening questions prior to the visit, additional usage of staff PPE, and extensive cleaning of exam room while observing appropriate contact time as indicated for disinfecting solutions.    HPI Daniel Allen presents for a CPX.  He is active and denies any recent episodes of palpitations, chest pain, shortness of breath, edema, or fatigue.  He is tolerating the statin well with no muscle or joint aches.  Outpatient Medications Prior to Visit  Medication Sig Dispense Refill  . aspirin EC 81 MG tablet Take by mouth.    Marland Kitchen atorvastatin (LIPITOR) 10 MG tablet TAKE 1 TABLET(10 MG) BY MOUTH DAILY AT 6 PM 90 tablet 1  . Mesalamine 800 MG TBEC Take 2 tablets (1,600 mg total) by mouth 2 (two) times daily. 120 tablet 11  . Multiple Vitamin (ONE-A-DAY MENS PO) Take by mouth.    . Na Sulfate-K Sulfate-Mg Sulf 17.5-3.13-1.6 GM/177ML SOLN Take 1 kit by mouth once for 1 dose. 354 mL 0   No facility-administered medications prior to visit.    ROS Review of Systems  Constitutional: Negative for diaphoresis, fatigue and unexpected weight change.  HENT: Negative.   Eyes: Negative.   Respiratory: Negative.  Negative for cough, chest tightness, shortness of breath and wheezing.   Cardiovascular: Negative for chest pain, palpitations and leg swelling.  Gastrointestinal: Negative for abdominal pain, blood in stool, constipation, diarrhea, nausea and vomiting.  Endocrine: Negative.   Genitourinary: Negative.  Negative for difficulty urinating, scrotal swelling, testicular pain and urgency.  Musculoskeletal: Negative for arthralgias and myalgias.  Skin: Negative.  Negative for color change and pallor.  Neurological: Negative.  Negative for dizziness, weakness,  light-headedness, numbness and headaches.  Hematological: Negative for adenopathy. Does not bruise/bleed easily.  Psychiatric/Behavioral: Negative.     Objective:  BP 128/80 (BP Location: Left Arm, Patient Position: Sitting, Cuff Size: Normal)   Pulse 65   Temp 98.3 F (36.8 C) (Oral)   Resp 16   Ht _0  (1.854 m)   Wt 194 lb (88 kg)   SpO2 98%   BMI 25.60 kg/m   BP Readings from Last 3 Encounters:  08/01/19 128/80  08/01/19 110/70  10/13/18 110/70    Wt Readings from Last 3 Encounters:  08/01/19 194 lb (88 kg)  08/01/19 194 lb 9.6 oz (88.3 kg)  10/13/18 192 lb (87.1 kg)    Physical Exam Vitals reviewed.  Constitutional:      Appearance: Normal appearance.  HENT:     Nose: Nose normal.     Mouth/Throat:     Mouth: Mucous membranes are moist.  Eyes:     General: No scleral icterus.    Conjunctiva/sclera: Conjunctivae normal.  Cardiovascular:     Rate and Rhythm: Normal rate and regular rhythm.     Pulses: Normal pulses.     Heart sounds: No murmur heard.   Pulmonary:     Effort: Pulmonary effort is normal.     Breath sounds: No stridor. No wheezing, rhonchi or rales.  Abdominal:     General: Abdomen is flat.     Palpations: There is no mass.     Tenderness: There is no abdominal tenderness. There is no guarding.     Hernia: No hernia is present. There is  no hernia in the left inguinal area or right inguinal area.  Genitourinary:    Pubic Area: No rash.      Penis: Normal and circumcised.      Testes: Normal.     Epididymis:     Right: Normal.     Left: Normal.     Prostate: Normal. Not enlarged, not tender and no nodules present.     Rectum: Normal. Guaiac result negative. No mass, tenderness, anal fissure, external hemorrhoid or internal hemorrhoid. Normal anal tone.  Musculoskeletal:        General: No tenderness. Normal range of motion.     Cervical back: Neck supple.     Right lower leg: No edema.  Lymphadenopathy:     Cervical: No cervical  adenopathy.     Lower Body: No right inguinal adenopathy. No left inguinal adenopathy.  Skin:    General: Skin is warm and dry.     Coloration: Skin is not pale.  Neurological:     General: No focal deficit present.     Mental Status: He is alert.  Psychiatric:        Mood and Affect: Mood normal.        Behavior: Behavior normal.        Thought Content: Thought content normal.     Lab Results  Component Value Date   WBC 8.1 08/01/2019   HGB 15.9 08/01/2019   HCT 47.6 08/01/2019   PLT 207 08/01/2019   GLUCOSE 86 08/01/2019   CHOL 164 08/01/2019   TRIG 141 08/01/2019   HDL 48 08/01/2019   LDLDIRECT 162.9 09/14/2013   LDLCALC 92 08/01/2019   ALT 21 08/01/2019   AST 20 08/01/2019   NA 140 08/01/2019   K 4.2 08/01/2019   CL 104 08/01/2019   CREATININE 1.01 08/01/2019   BUN 16 08/01/2019   CO2 28 08/01/2019   TSH 2.01 08/01/2019   PSA 1.2 08/01/2019   HGBA1C 5.3 08/01/2019    CT Abdomen Pelvis W Contrast  Result Date: 03/02/2018 CLINICAL DATA:  Abdominal pain EXAM: CT ABDOMEN AND PELVIS WITH CONTRAST TECHNIQUE: Multidetector CT imaging of the abdomen and pelvis was performed using the standard protocol following bolus administration of intravenous contrast. CONTRAST:  181m ISOVUE-300 IOPAMIDOL (ISOVUE-300) INJECTION 61% COMPARISON:  June 14, 2016 FINDINGS: Lower chest: There is bibasilar atelectasis. There is no lung base edema or consolidation. Hepatobiliary: No focal liver lesions are appreciable. Gallbladder wall is not appreciably thickened. There is no biliary duct dilatation. Pancreas: There is no pancreatic mass or inflammatory focus. Spleen: Spleen measures 13.3 x 6.7 x 11.7 cm with a measured splenic diameter of 521 cubic cm. No focal splenic lesions are evident. Adrenals/Urinary Tract: Adrenals bilaterally appear normal. There is a cyst arising from the medial upper pole of the right kidney measuring 1 x 1 cm. There is no appreciable hydronephrosis on either side. There  is no renal or ureteral calculus on either side. Urinary bladder is midline with wall thickness within normal limits. Stomach/Bowel: There are multiple diverticula throughout the descending colon and sigmoid regions. No evident diverticulitis. Elsewhere, there is no appreciable bowel wall or mesenteric thickening. No evident bowel obstruction. No free air or portal venous air. Vascular/Lymphatic: There is aortic atherosclerosis. No aneurysm evident. Major mesenteric arterial vessels are patent. There is no adenopathy in the abdomen or pelvis. Reproductive:: Prostate and seminal vesicles appear normal in size and contour. No evident pelvic mass. Other:: Appendix appears unremarkable. No abscess or ascites is  evident in the abdomen or pelvis. Musculoskeletal: There are no blastic or lytic bone lesions. There is no intramuscular or abdominal wall lesion. IMPRESSION: 1. Multiple descending colonic and sigmoid colonic diverticula without diverticulitis. No bowel obstruction. No abscess in the abdomen or pelvis. Appendix appears normal. 2. No renal or ureteral calculi. No hydronephrosis. Urinary bladder wall thickness is within normal limits. 3.  Prominent spleen without focal splenic lesion evident. 4.  Aortic atherosclerosis. Electronically Signed   By: Lowella Grip III M.D.   On: 03/02/2018 14:13   DG ABD ACUTE 2+V W 1V CHEST  Result Date: 03/02/2018 CLINICAL DATA:  Acute periumbilical abdominal pain. EXAM: DG ABDOMEN ACUTE W/ 1V CHEST COMPARISON:  None. FINDINGS: There is no evidence of dilated bowel loops or free intraperitoneal air. No radiopaque calculi or other significant radiographic abnormality is seen. Heart size and mediastinal contours are within normal limits. Both lungs are clear. IMPRESSION: No evidence of bowel obstruction or ileus. No acute cardiopulmonary disease. Electronically Signed   By: Marijo Conception, M.D.   On: 03/02/2018 09:14    Assessment & Plan:   Haneef was seen today for  annual exam and hyperlipidemia.  Diagnoses and all orders for this visit:  Routine general medical examination at a health care facility- Exam completed, labs reviewed, vaccines reviewed and updated, cancer screenings are up-to-date, patient education was given. -     Lipid panel; Future -     PSA; Future -     PSA -     Lipid panel  Hyperlipidemia LDL goal <100- He has achieved his LDL goal and is doing well on the statin. -     Hepatic function panel; Future -     TSH; Future -     TSH -     Hepatic function panel  PAC (premature atrial contraction)- He is having no symptoms or complications related to this. -     TSH; Future -     TSH  Hyperglycemia- His blood sugar is normal now. -     BASIC METABOLIC PANEL WITH GFR; Future -     Hemoglobin A1c; Future -     Hemoglobin A1c -     BASIC METABOLIC PANEL WITH GFR  Crohn's disease without complication, unspecified gastrointestinal tract location [facility])- He is well controlled with mesalamine. -     CBC with Differential/Platelet; Future -     CBC with Differential/Platelet  Need for pneumococcal vaccination -     Pneumococcal polysaccharide vaccine 23-valent greater than or equal to 2yo subcutaneous/IM   I am having Alvester Chou C. Meador maintain his aspirin EC, Multiple Vitamin (ONE-A-DAY MENS PO), atorvastatin, and Mesalamine.  No orders of the defined types were placed in this encounter.    Follow-up: Return in about 6 months (around 02/01/2020).  Scarlette Calico, MD

## 2019-08-01 NOTE — Progress Notes (Signed)
GI Progress Note  Chief Complaint: Crohn's colitis  Subjective  History: From my October 2020 visit note: "Jamesen was a new patient to me by telemedicine in April of this year, having previously seen Dr. Deatra Ina.   Records indicate that in 2012 Dr. Deatra Ina reported the patient had had 15 years of Crohn's colitis under control with Asacol.  Colonoscopy November 2012 had a sending colon inflammation with ulcers.  The biopsy showed active IBD without dysplasia.  That diagnosis had initially been made in [place].   Colonoscopy with Dr. Deatra Ina September 2014 complete to the terminal ileum was normal except for mildly erythematous mucosa in the distal rectum.  Random biopsies from the colon and rectum were normal.  Somewhere along the way Gaven stopped taking Asacol.   Colonoscopy by me on 08/11/2018 showed active colitis from the cecum to the proximal sigmoid.  It was continuous and circumferential until the descending, where it then became somewhat patchy until the proximal sigmoid and then spared the colon from the mid sigmoid through the rectum. Once biopsies were resulted as below, I prescribed Asacol, with plans for a repeat colonoscopy in 1 year, at which time he will need extensive surveillance biopsies.   There was a phone call in early August that the Asacol was not available by his pharmacy, covering physician prescribed Apriso.  It is unclear whether his pharmacy just did not stop the medicine, or if there was an insurance coverage issue.  The last message we got from him was that he decided to take the Asacol, but he tells me today that in fact he took the [facility].  However, he had to stop after several days because it caused abdominal cramps and diarrhea.   He is back to feeling well as before, with no chronic abdominal pain diarrhea or rectal bleeding."  ___________________________________ Colonoscopy July 2020 with moderate colitis from cecum to left colon, sparing  distal sigmoid and rectum. Biopsies confirmed active colitis.  Extensive surveillance biopsies were not taken due to the active colitis and the fact the patient was not on Asacol at the time.  Plan was for surveillance colonoscopy in a year. He has continued Asacol since then.  Shone has felt quite well since I last saw him a year ago.  He is taking the Asacol 1600 mg twice daily without difficulty or side effects.  He really does not feel much different on it, since his colitis was minimally symptomatic even off meds.  He denies abdominal pain, diarrhea or rectal bleeding.  Appetite has been good and weight stable.  ROS: Cardiovascular:  no chest pain Respiratory: no dyspnea No dysuria or hematuria The patient's Past Medical, Family and Social History were reviewed and are on file in the EMR.  Objective:  Med list reviewed  Current Outpatient Medications:  .  aspirin EC 81 MG tablet, Take by mouth., Disp: , Rfl:  .  atorvastatin (LIPITOR) 10 MG tablet, TAKE 1 TABLET(10 MG) BY MOUTH DAILY AT 6 PM, Disp: 90 tablet, Rfl: 1 .  Mesalamine 800 MG TBEC, Take 2 tablets (1,600 mg total) by mouth 2 (two) times daily., Disp: 120 tablet, Rfl: 11 .  Multiple Vitamin (ONE-A-DAY MENS PO), Take by mouth., Disp: , Rfl:  .  Na Sulfate-K Sulfate-Mg Sulf 17.5-3.13-1.6 GM/177ML SOLN, Take 1 kit by mouth once for 1 dose., Disp: 354 mL, Rfl: 0   Vital signs in last 24 hrs: Vitals:   08/01/19 0821  BP: 110/70  Pulse:  76    Physical Exam  Well-appearing  HEENT: sclera anicteric, oral mucosa moist without lesions  Neck: supple, no thyromegaly, JVD or lymphadenopathy  Cardiac: RRR without murmurs, S1S2 heard, no peripheral edema  Pulm: clear to auscultation bilaterally, normal RR and effort noted  Abdomen: soft, no tenderness, with active bowel sounds. No guarding or palpable hepatosplenomegaly.  Labs:  CMP Latest Ref Rng & Units 03/02/2018 04/20/2017 04/15/2016  Glucose 70 - 99 mg/dL 130(H) 94 81    BUN 6 - 23 mg/dL 15 14 13   Creatinine 0.40 - 1.50 mg/dL 1.00 1.06 0.97  Sodium 135 - 145 mEq/L 141 139 140  Potassium 3.5 - 5.1 mEq/L 3.9 4.6 4.2  Chloride 96 - 112 mEq/L 103 103 104  CO2 19 - 32 mEq/L 31 29 31   Calcium 8.4 - 10.5 mg/dL 9.1 9.3 9.5  Total Protein 6.0 - 8.3 g/dL 7.4 7.1 7.0  Total Bilirubin 0.2 - 1.2 mg/dL 0.5 0.8 0.5  Alkaline Phos 39 - 117 U/L 74 59 64  AST 0 - 37 U/L 17 24 19   ALT 0 - 53 U/L 21 27 19     ___________________________________________ Radiologic studies:   ____________________________________________ Other:   _____________________________________________ Assessment & Plan  Assessment: Encounter Diagnosis  Name Primary?  . Crohn's disease of colon without complication ([place]) Yes   Asymptomatic on mesalamine.  Extent and duration of condition warrants surveillance colonoscopy and biopsies.   Plan: Colonoscopy.  He was agreeable after discussion of procedure and risks.  I noted after clinic visit that his last creatinine check was about 18 months ago.  Rare risk of nephritis from mesalamine, so we will check BUN and creatinine when patient comes for his colonoscopy.   30 minutes were spent on this encounter (including chart review, history/exam, counseling/coordination of care, and documentation)  Nelida Meuse III

## 2019-08-02 LAB — CBC WITH DIFFERENTIAL/PLATELET
Absolute Monocytes: 421 cells/uL (ref 200–950)
Basophils Absolute: 41 cells/uL (ref 0–200)
Basophils Relative: 0.5 %
Eosinophils Absolute: 170 cells/uL (ref 15–500)
Eosinophils Relative: 2.1 %
HCT: 47.6 % (ref 38.5–50.0)
Hemoglobin: 15.9 g/dL (ref 13.2–17.1)
Lymphs Abs: 1418 cells/uL (ref 850–3900)
MCH: 30.6 pg (ref 27.0–33.0)
MCHC: 33.4 g/dL (ref 32.0–36.0)
MCV: 91.5 fL (ref 80.0–100.0)
MPV: 10.6 fL (ref 7.5–12.5)
Monocytes Relative: 5.2 %
Neutro Abs: 6051 cells/uL (ref 1500–7800)
Neutrophils Relative %: 74.7 %
Platelets: 207 10*3/uL (ref 140–400)
RBC: 5.2 10*6/uL (ref 4.20–5.80)
RDW: 12.3 % (ref 11.0–15.0)
Total Lymphocyte: 17.5 %
WBC: 8.1 10*3/uL (ref 3.8–10.8)

## 2019-08-02 LAB — HEPATIC FUNCTION PANEL
AG Ratio: 2 (calc) (ref 1.0–2.5)
ALT: 21 U/L (ref 9–46)
AST: 20 U/L (ref 10–35)
Albumin: 4.8 g/dL (ref 3.6–5.1)
Alkaline phosphatase (APISO): 68 U/L (ref 35–144)
Bilirubin, Direct: 0.1 mg/dL (ref 0.0–0.2)
Globulin: 2.4 g/dL (calc) (ref 1.9–3.7)
Indirect Bilirubin: 0.4 mg/dL (calc) (ref 0.2–1.2)
Total Bilirubin: 0.5 mg/dL (ref 0.2–1.2)
Total Protein: 7.2 g/dL (ref 6.1–8.1)

## 2019-08-02 LAB — TSH: TSH: 2.01 mIU/L (ref 0.40–4.50)

## 2019-08-02 LAB — PSA: PSA: 1.2 ng/mL (ref <=4.0)

## 2019-08-02 LAB — BASIC METABOLIC PANEL WITH GFR
BUN: 16 mg/dL (ref 7–25)
CO2: 28 mmol/L (ref 20–32)
Calcium: 9.6 mg/dL (ref 8.6–10.3)
Chloride: 104 mmol/L (ref 98–110)
Creat: 1.01 mg/dL (ref 0.70–1.33)
GFR, Est African American: 94 mL/min/{1.73_m2} (ref >=60)
GFR, Est Non African American: 81 mL/min/{1.73_m2} (ref >=60)
Glucose, Bld: 86 mg/dL (ref 65–99)
Potassium: 4.2 mmol/L (ref 3.5–5.3)
Sodium: 140 mmol/L (ref 135–146)

## 2019-08-02 LAB — HEMOGLOBIN A1C
Hgb A1c MFr Bld: 5.3 % of total Hgb (ref <5.7)
Mean Plasma Glucose: 105 (calc)
eAG (mmol/L): 5.8 (calc)

## 2019-08-02 LAB — LIPID PANEL
Cholesterol: 164 mg/dL (ref <200)
HDL: 48 mg/dL (ref >=40)
LDL Cholesterol (Calc): 92 mg/dL (calc)
Non-HDL Cholesterol (Calc): 116 mg/dL (calc) (ref <130)
Total CHOL/HDL Ratio: 3.4 (calc) (ref <5.0)
Triglycerides: 141 mg/dL (ref <150)

## 2019-09-04 ENCOUNTER — Telehealth: Payer: Self-pay | Admitting: Internal Medicine

## 2019-09-04 ENCOUNTER — Other Ambulatory Visit: Payer: Self-pay | Admitting: Internal Medicine

## 2019-09-04 DIAGNOSIS — L247 Irritant contact dermatitis due to plants, except food: Secondary | ICD-10-CM | POA: Insufficient documentation

## 2019-09-04 MED ORDER — CLOBETASOL PROPIONATE 0.05 % EX OINT: 1.0000 "application " | TOPICAL_OINTMENT | Freq: Two times a day (BID) | CUTANEOUS | 1 refills | Status: DC

## 2019-09-04 NOTE — Telephone Encounter (Signed)
Pt is requesting rx for the triamcinolone ointment. Please advise if rx can be sent in?

## 2019-09-04 NOTE — Telephone Encounter (Signed)
New message:   Pt is calling and states he has got some more poison ivy on him again. Pt would like to know if Dr. Ronnald Ramp can call in the prescription he used the last time he had it to [place], [address] AT [address]. Please advise.

## 2019-09-05 NOTE — Telephone Encounter (Signed)
F/u    The patient calling to check on the status of yesterday messages patient is aware that MD is in the clinic today seeing scheduled patients

## 2019-09-11 DIAGNOSIS — H524 Presbyopia: Secondary | ICD-10-CM | POA: Diagnosis not present

## 2019-09-11 DIAGNOSIS — H5203 Hypermetropia, bilateral: Secondary | ICD-10-CM | POA: Diagnosis not present

## 2019-09-11 DIAGNOSIS — H52223 Regular astigmatism, bilateral: Secondary | ICD-10-CM | POA: Diagnosis not present

## 2019-10-02 ENCOUNTER — Encounter: Payer: Self-pay | Admitting: Gastroenterology

## 2019-10-10 ENCOUNTER — Encounter: Payer: Self-pay | Admitting: Gastroenterology

## 2019-10-10 ENCOUNTER — Other Ambulatory Visit: Payer: Self-pay

## 2019-10-10 ENCOUNTER — Ambulatory Visit (AMBULATORY_SURGERY_CENTER): Payer: BC Managed Care – PPO | Admitting: *Deleted

## 2019-10-10 VITALS — Ht 73.0 in | Wt 190.0 lb

## 2019-10-10 DIAGNOSIS — K5010 Crohn's disease of large intestine without complications: Secondary | ICD-10-CM

## 2019-10-10 MED ORDER — NA SULFATE-K SULFATE-MG SULF 17.5-3.13-1.6 GM/177ML PO SOLN: ORAL | 0 refills | Status: DC

## 2019-10-10 NOTE — Progress Notes (Signed)
Patient's pre-visit was done today over the phone with the patient due to COVID-19 pandemic. Name,DOB and address verified. Insurance verified. Packet of Prep instructions mailed to patient including copy of a consent form and pre-procedure patient acknowledgement form-pt is aware.suprep Coupon included. Patient understands to call us back with any questions or concerns. YOMAY-04 vaccdines completed 05/2019 per pt.Pt is aware that care partner will wait in the car during procedure; if they feel like they will be too hot or cold to wait in the car; they may wait in the 4 th floor lobby. Patient is aware to bring only one care partner. We want them to wear a mask (we do not have any that we can provide them), practice social distancing, and we will check their temperatures when they get here.  I did remind the patient that their care partner needs to stay in the parking lot the entire time and have a cell phone available, we will call them when the pt is ready for discharge. Patient will wear mask into building.

## 2019-10-11 ENCOUNTER — Encounter: Payer: BC Managed Care – PPO | Admitting: Gastroenterology

## 2019-10-18 ENCOUNTER — Other Ambulatory Visit (INDEPENDENT_AMBULATORY_CARE_PROVIDER_SITE_OTHER): Payer: BC Managed Care – PPO

## 2019-10-18 ENCOUNTER — Other Ambulatory Visit: Payer: Self-pay

## 2019-10-18 ENCOUNTER — Ambulatory Visit (AMBULATORY_SURGERY_CENTER): Payer: BC Managed Care – PPO | Admitting: Gastroenterology

## 2019-10-18 ENCOUNTER — Encounter: Payer: Self-pay | Admitting: Gastroenterology

## 2019-10-18 VITALS — BP 124/73 | HR 51 | Temp 97.5°F | Resp 16 | Ht 73.0 in | Wt 190.0 lb

## 2019-10-18 DIAGNOSIS — K5010 Crohn's disease of large intestine without complications: Secondary | ICD-10-CM

## 2019-10-18 DIAGNOSIS — K5090 Crohn's disease, unspecified, without complications: Secondary | ICD-10-CM | POA: Diagnosis not present

## 2019-10-18 DIAGNOSIS — N289 Disorder of kidney and ureter, unspecified: Secondary | ICD-10-CM

## 2019-10-18 DIAGNOSIS — K529 Noninfective gastroenteritis and colitis, unspecified: Secondary | ICD-10-CM | POA: Diagnosis not present

## 2019-10-18 DIAGNOSIS — K5730 Diverticulosis of large intestine without perforation or abscess without bleeding: Secondary | ICD-10-CM | POA: Diagnosis not present

## 2019-10-18 LAB — CREATININE, SERUM: Creatinine, Ser: 0.98 mg/dL (ref 0.40–1.50)

## 2019-10-18 LAB — BUN: BUN: 10 mg/dL (ref 6–23)

## 2019-10-18 MED ORDER — SODIUM CHLORIDE 0.9 % IV SOLN: 500.0000 mL | INTRAVENOUS | Status: DC

## 2019-10-18 NOTE — Op Note (Signed)
[place] Patient Name: Daniel Allen Procedure Date: 10/18/2019 8:30 AM MRN: [id number] Endoscopist: Mallie Mussel L. Loletha Carrow , MD Age: 59 Referring MD:  Date of Birth: [birth year] Gender: Male Account #: [id number] Procedure:                Colonoscopy Indications:              Follow-up of Crohn's disease of the colon                            (generally mild disease - good control many years                            on mesalamine) Medicines:                Monitored Anesthesia Care Procedure:                Pre-Anesthesia Assessment:                           - Prior to the procedure, a History and Physical                            was performed, and patient medications and                            allergies were reviewed. The patient's tolerance of                            previous anesthesia was also reviewed. The risks                            and benefits of the procedure and the sedation                            options and risks were discussed with the patient.                            All questions were answered, and informed consent                            was obtained. Prior Anticoagulants: The patient has                            taken no previous anticoagulant or antiplatelet                            agents except for aspirin. ASA Grade Assessment: II                            - A patient with mild systemic disease. After                            reviewing the risks and benefits, the patient was  deemed in satisfactory condition to undergo the                            procedure.                           After obtaining informed consent, the colonoscope                            was passed under direct vision. Throughout the                            procedure, the patient's blood pressure, pulse, and                            oxygen saturations were monitored continuously. The                             Colonoscope was introduced through the anus and                            advanced to the the terminal ileum, with                            identification of the appendiceal orifice and IC                            valve. The colonoscopy was performed without                            difficulty. The patient tolerated the procedure                            well. The quality of the bowel preparation was                            good. The terminal ileum, ileocecal valve,                            appendiceal orifice, and rectum were photographed. Scope In: 8:33:44 AM Scope Out: 8:56:24 AM Scope Withdrawal Time: 0 hours 18 minutes 19 seconds  Total Procedure Duration: 0 hours 22 minutes 40 seconds  Findings:                 The perianal and digital rectal examinations were                            normal. Specifically, no peri-anal Crohn's activity                            was seen.                           The terminal ileum appeared normal.  Multiple diverticula were found in the left colon.                           Normal mucosa was found in the entire colon. No                            raised or suspicious areas were seen under WL or                            NBI examination. Four biopsies were taken every 10                            cm with a cold forceps from the entire colon for                            Crohn's disease surveillance and dysplasia                            screening. These biopsy specimens were sent to                            pathology.                           The exam was otherwise without abnormality on                            direct and retroflexion views. Complications:            No immediate complications. Estimated Blood Loss:     Estimated blood loss was minimal. Impression:               - The examined portion of the ileum was normal.                           - Diverticulosis in the left colon.                            - Normal mucosa in the entire examined colon.                            Biopsied.                           - The examination was otherwise normal on direct                            and retroflexion views. Recommendation:           - Patient has a contact number available for                            emergencies. The signs and symptoms of potential                            delayed  complications were discussed with the                            patient. Return to normal activities tomorrow.                            Written discharge instructions were provided to the                            patient.                           - Resume previous diet.                           - Continue present medications.                           - Await pathology results.                           - Repeat colonoscopy in 2 years for surveillance.                           - Draw BUN and creatinine today for renal function                            surveillance in patient on chronic mesalamine. Daniel Allen L. Loletha Carrow, MD 10/18/2019 9:04:44 AM This report has been signed electronically.

## 2019-10-18 NOTE — Progress Notes (Signed)
[employer]

## 2019-10-18 NOTE — Patient Instructions (Signed)
Handout provided on diverticulosis Await pathology results   YOU HAD AN ENDOSCOPIC PROCEDURE TODAY AT [facility]:   Refer to the procedure report that was given to you for any specific questions about what was found during the examination.  If the procedure report does not answer your questions, please call your gastroenterologist to clarify.  If you requested that your care partner not be given the details of your procedure findings, then the procedure report has been included in a sealed envelope for you to review at your convenience later.  YOU SHOULD EXPECT: Some feelings of bloating in the abdomen. Passage of more gas than usual.  Walking can help get rid of the air that was put into your GI tract during the procedure and reduce the bloating. If you had a lower endoscopy (such as a colonoscopy or flexible sigmoidoscopy) you may notice spotting of blood in your stool or on the toilet paper. If you underwent a bowel prep for your procedure, you may not have a normal bowel movement for a few days.  Please Note:  You might notice some irritation and congestion in your nose or some drainage.  This is from the oxygen used during your procedure.  There is no need for concern and it should clear up in a day or so.  SYMPTOMS TO REPORT IMMEDIATELY:   Following lower endoscopy (colonoscopy or flexible sigmoidoscopy):  Excessive amounts of blood in the stool  Significant tenderness or worsening of abdominal pains  Swelling of the abdomen that is new, acute  Fever of 100F or higher  For urgent or emergent issues, a gastroenterologist can be reached at any hour by calling [phone]. Do not use MyChart messaging for urgent concerns.    DIET:  We do recommend a small meal at first, but then you may proceed to your regular diet.  Drink plenty of fluids but you should avoid alcoholic beverages for 24 hours.  ACTIVITY:  You should plan to take it easy for the rest of today and you  should NOT DRIVE or use heavy machinery until tomorrow (because of the sedation medicines used during the test).    FOLLOW UP: Our staff will call the number listed on your records 48-72 hours following your procedure to check on you and address any questions or concerns that you may have regarding the information given to you following your procedure. If we do not reach you, we will leave a message.  We will attempt to reach you two times.  During this call, we will ask if you have developed any symptoms of COVID 19. If you develop any symptoms (ie: fever, flu-like symptoms, shortness of breath, cough etc.) before then, please call [phone].  If you test positive for Covid 19 in the 2 weeks post procedure, please call and report this information to [place].    If any biopsies were taken you will be contacted by phone or by letter within the next 1-3 weeks.  Please call us at [phone] if you have not heard about the biopsies in 3 weeks.    SIGNATURES/CONFIDENTIALITY: You and/or your care partner have signed paperwork which will be entered into your electronic medical record.  These signatures attest to the fact that that the information above on your After Visit Summary has been reviewed and is understood.  Full responsibility of the confidentiality of this discharge information lies with you and/or your care-partner.

## 2019-10-18 NOTE — Progress Notes (Signed)
PT taken to PACU. Monitors in place. VSS. Report given to RN. 

## 2019-10-18 NOTE — Progress Notes (Signed)
Called to room to assist during endoscopic procedure.  Patient ID and intended procedure confirmed with present staff. Received instructions for my participation in the procedure from the performing physician.  

## 2019-10-22 ENCOUNTER — Telehealth: Payer: Self-pay

## 2019-10-22 NOTE — Telephone Encounter (Signed)
  Follow up Call-  Call back number 10/18/2019 08/11/2018  Post procedure Call Back phone  # [phone]  Permission to leave phone message Yes Yes  Some recent data might be hidden     Patient questions:  Do you have a fever, pain , or abdominal swelling? No. Pain Score  0 *  Have you tolerated food without any problems? Yes.    Have you been able to return to your normal activities? Yes.    Do you have any questions about your discharge instructions: Diet   No. Medications  No. Follow up visit  No.  Do you have questions or concerns about your Care? No.  Actions: * If pain score is 4 or above: No action needed, pain <4.  1. Have you developed a fever since your procedure? no  2.   Have you had an respiratory symptoms (SOB or cough) since your procedure? no  3.   Have you tested positive for COVID 19 since your procedure no  4.   Have you had any family members/close contacts diagnosed with the COVID 19 since your procedure?  no   If yes to any of these questions please route to Joylene John, RN and Joella Prince, RN

## 2019-10-25 ENCOUNTER — Encounter: Payer: Self-pay | Admitting: Gastroenterology

## 2019-11-05 DIAGNOSIS — Z85828 Personal history of other malignant neoplasm of skin: Secondary | ICD-10-CM | POA: Diagnosis not present

## 2019-11-05 DIAGNOSIS — D1801 Hemangioma of skin and subcutaneous tissue: Secondary | ICD-10-CM | POA: Diagnosis not present

## 2019-11-05 DIAGNOSIS — D225 Melanocytic nevi of trunk: Secondary | ICD-10-CM | POA: Diagnosis not present

## 2019-11-05 DIAGNOSIS — L821 Other seborrheic keratosis: Secondary | ICD-10-CM | POA: Diagnosis not present

## 2019-12-17 ENCOUNTER — Ambulatory Visit: Payer: BC Managed Care – PPO | Admitting: Internal Medicine

## 2019-12-17 ENCOUNTER — Encounter: Payer: Self-pay | Admitting: Internal Medicine

## 2019-12-17 ENCOUNTER — Other Ambulatory Visit: Payer: Self-pay

## 2019-12-17 VITALS — BP 144/86 | HR 74 | Temp 98.3°F | Resp 16 | Ht 73.0 in | Wt 193.0 lb

## 2019-12-17 DIAGNOSIS — R0789 Other chest pain: Secondary | ICD-10-CM

## 2019-12-17 DIAGNOSIS — R202 Paresthesia of skin: Secondary | ICD-10-CM | POA: Diagnosis not present

## 2019-12-17 DIAGNOSIS — R0600 Dyspnea, unspecified: Secondary | ICD-10-CM | POA: Diagnosis not present

## 2019-12-17 DIAGNOSIS — Z23 Encounter for immunization: Secondary | ICD-10-CM | POA: Diagnosis not present

## 2019-12-17 DIAGNOSIS — I25118 Atherosclerotic heart disease of native coronary artery with other forms of angina pectoris: Secondary | ICD-10-CM

## 2019-12-17 DIAGNOSIS — I491 Atrial premature depolarization: Secondary | ICD-10-CM

## 2019-12-17 DIAGNOSIS — R200 Anesthesia of skin: Secondary | ICD-10-CM | POA: Insufficient documentation

## 2019-12-17 DIAGNOSIS — R0609 Other forms of dyspnea: Secondary | ICD-10-CM | POA: Insufficient documentation

## 2019-12-17 LAB — CBC WITH DIFFERENTIAL/PLATELET
Basophils Absolute: 0 10*3/uL (ref 0.0–0.1)
Basophils Relative: 0.6 % (ref 0.0–3.0)
Eosinophils Absolute: 0.2 10*3/uL (ref 0.0–0.7)
Eosinophils Relative: 2.2 % (ref 0.0–5.0)
HCT: 43.8 % (ref 39.0–52.0)
Hemoglobin: 15.1 g/dL (ref 13.0–17.0)
Lymphocytes Relative: 17.3 % (ref 12.0–46.0)
Lymphs Abs: 1.4 10*3/uL (ref 0.7–4.0)
MCHC: 34.6 g/dL (ref 30.0–36.0)
MCV: 90.1 fl (ref 78.0–100.0)
Monocytes Absolute: 0.4 10*3/uL (ref 0.1–1.0)
Monocytes Relative: 4.4 % (ref 3.0–12.0)
Neutro Abs: 6.1 10*3/uL (ref 1.4–7.7)
Neutrophils Relative %: 75.5 % (ref 43.0–77.0)
Platelets: 204 10*3/uL (ref 150.0–400.0)
RBC: 4.85 Mil/uL (ref 4.22–5.81)
RDW: 13.3 % (ref 11.5–15.5)
WBC: 8.1 10*3/uL (ref 4.0–10.5)

## 2019-12-17 LAB — TROPONIN I (HIGH SENSITIVITY): High Sens Troponin I: 4 ng/L (ref 2–17)

## 2019-12-17 LAB — BRAIN NATRIURETIC PEPTIDE: Pro B Natriuretic peptide (BNP): 15 pg/mL (ref 0.0–100.0)

## 2019-12-17 LAB — VITAMIN B12: Vitamin B-12: 396 pg/mL (ref 211–911)

## 2019-12-17 LAB — FOLATE: Folate: >23.6 ng/mL (ref >5.9)

## 2019-12-17 NOTE — Patient Instructions (Signed)

## 2019-12-17 NOTE — Progress Notes (Addendum)
Subjective:  Patient ID: Daniel Allen, male    DOB: 03-Nov-1960  Age: 59 y.o. MRN: [id number]  CC: Hypertension  This visit occurred during the SARS-CoV-2 public health emergency.  Safety protocols were in place, including screening questions prior to the visit, additional usage of staff PPE, and extensive cleaning of exam room while observing appropriate contact time as indicated for disinfecting solutions.    HPI Daniel Allen presents for the complaint of a 3-week history of chest pain that he describes as a tightness under his mid sternum.  He has also had mild dyspnea on exertion and numbness and tingling in his right upper and right lower extremity.  He has had some cold intolerance but denies diaphoresis, fatigue, dizziness, lightheadedness, cough, hemoptysis, edema, or near syncope.  Outpatient Medications Prior to Visit  Medication Sig Dispense Refill  . atorvastatin (LIPITOR) 10 MG tablet TAKE 1 TABLET(10 MG) BY MOUTH DAILY AT 6 PM 90 tablet 1  . clobetasol ointment (TEMOVATE) 5.87 % Apply 1 application topically 2 (two) times daily. 45 g 1  . Mesalamine 800 MG TBEC Take 2 tablets (1,600 mg total) by mouth 2 (two) times daily. 120 tablet 11  . Multiple Vitamin (ONE-A-DAY MENS PO) Take by mouth.    Marland Kitchen aspirin EC 81 MG tablet Take by mouth.     No facility-administered medications prior to visit.    ROS Review of Systems  Constitutional: Negative for appetite change, chills, diaphoresis, fatigue and fever.  HENT: Negative.   Eyes: Negative.   Respiratory: Positive for chest tightness and shortness of breath. Negative for cough, wheezing and stridor.   Cardiovascular: Negative for chest pain, palpitations and leg swelling.  Gastrointestinal: Negative for abdominal pain, constipation, diarrhea, nausea and vomiting.  Endocrine: Positive for cold intolerance. Negative for heat intolerance.  Genitourinary: Negative.  Negative for difficulty urinating.  Musculoskeletal: Negative.   Negative for back pain and myalgias.  Skin: Negative.  Negative for color change and rash.  Neurological: Negative.  Negative for dizziness, weakness, light-headedness, numbness and headaches.  Hematological: Negative for adenopathy. Does not bruise/bleed easily.  Psychiatric/Behavioral: Negative.     Objective:  BP (!) 144/86   Pulse 74   Temp 98.3 F (36.8 C) (Oral)   Resp 16   Ht 6' 1"  (1.854 m)   Wt 193 lb (87.5 kg)   SpO2 98%   BMI 25.46 kg/m   BP Readings from Last 3 Encounters:  12/17/19 (!) 144/86  10/18/19 124/73  08/01/19 128/80    Wt Readings from Last 3 Encounters:  12/17/19 193 lb (87.5 kg)  10/18/19 190 lb (86.2 kg)  10/10/19 190 lb (86.2 kg)    Physical Exam Vitals reviewed.  Constitutional:      Appearance: Normal appearance.  HENT:     Nose: Nose normal.     Mouth/Throat:     Mouth: Mucous membranes are moist.  Eyes:     General: No scleral icterus.    Conjunctiva/sclera: Conjunctivae normal.  Cardiovascular:     Rate and Rhythm: Normal rate and regular rhythm.     Pulses:          Carotid pulses are 1+ on the right side and 1+ on the left side.      Radial pulses are 1+ on the right side and 1+ on the left side.       Femoral pulses are 1+ on the right side and 1+ on the left side.      Popliteal  pulses are 1+ on the right side and 1+ on the left side.       Dorsalis pedis pulses are 1+ on the right side and 1+ on the left side.       Posterior tibial pulses are 1+ on the right side and 1+ on the left side.     Heart sounds: Normal heart sounds, S1 normal and S2 normal. No murmur heard.  No friction rub. No gallop.      Comments: EKG- NSR, 71 bpm Normal EKG Pulmonary:     Effort: Pulmonary effort is normal.     Breath sounds: No stridor. No wheezing, rhonchi or rales.  Abdominal:     General: Abdomen is flat. Bowel sounds are normal. There is no distension.     Palpations: Abdomen is soft. There is no hepatomegaly, splenomegaly or mass.      Tenderness: There is no abdominal tenderness.  Musculoskeletal:     Cervical back: Neck supple.     Right lower leg: No edema.     Left lower leg: No edema.  Lymphadenopathy:     Cervical: No cervical adenopathy.  Skin:    General: Skin is warm and dry.     Coloration: Skin is not pale.  Neurological:     General: No focal deficit present.     Mental Status: He is alert and oriented to person, place, and time. Mental status is at baseline.     Cranial Nerves: No cranial nerve deficit.     Sensory: No sensory deficit.     Motor: No weakness.     Coordination: Coordination normal.     Gait: Gait normal.     Deep Tendon Reflexes: Reflexes normal.  Psychiatric:        Mood and Affect: Mood normal.     Lab Results  Component Value Date   WBC 8.1 12/17/2019   HGB 15.1 12/17/2019   HCT 43.8 12/17/2019   PLT 204.0 12/17/2019   GLUCOSE 86 08/01/2019   CHOL 164 08/01/2019   TRIG 141 08/01/2019   HDL 48 08/01/2019   LDLDIRECT 162.9 09/14/2013   LDLCALC 92 08/01/2019   ALT 21 08/01/2019   AST 20 08/01/2019   NA 140 08/01/2019   K 4.2 08/01/2019   CL 104 08/01/2019   CREATININE 0.98 10/18/2019   BUN 10 10/18/2019   CO2 28 08/01/2019   TSH 2.01 08/01/2019   PSA 1.2 08/01/2019   HGBA1C 5.3 08/01/2019    CT Abdomen Pelvis W Contrast  Result Date: 03/02/2018 CLINICAL DATA:  Abdominal pain EXAM: CT ABDOMEN AND PELVIS WITH CONTRAST TECHNIQUE: Multidetector CT imaging of the abdomen and pelvis was performed using the standard protocol following bolus administration of intravenous contrast. CONTRAST:  121m ISOVUE-300 IOPAMIDOL (ISOVUE-300) INJECTION 61% COMPARISON:  June 14, 2016 FINDINGS: Lower chest: There is bibasilar atelectasis. There is no lung base edema or consolidation. Hepatobiliary: No focal liver lesions are appreciable. Gallbladder wall is not appreciably thickened. There is no biliary duct dilatation. Pancreas: There is no pancreatic mass or inflammatory focus.  Spleen: Spleen measures 13.3 x 6.7 x 11.7 cm with a measured splenic diameter of 521 cubic cm. No focal splenic lesions are evident. Adrenals/Urinary Tract: Adrenals bilaterally appear normal. There is a cyst arising from the medial upper pole of the right kidney measuring 1 x 1 cm. There is no appreciable hydronephrosis on either side. There is no renal or ureteral calculus on either side. Urinary bladder is midline with wall thickness  within normal limits. Stomach/Bowel: There are multiple diverticula throughout the descending colon and sigmoid regions. No evident diverticulitis. Elsewhere, there is no appreciable bowel wall or mesenteric thickening. No evident bowel obstruction. No free air or portal venous air. Vascular/Lymphatic: There is aortic atherosclerosis. No aneurysm evident. Major mesenteric arterial vessels are patent. There is no adenopathy in the abdomen or pelvis. Reproductive:: Prostate and seminal vesicles appear normal in size and contour. No evident pelvic mass. Other:: Appendix appears unremarkable. No abscess or ascites is evident in the abdomen or pelvis. Musculoskeletal: There are no blastic or lytic bone lesions. There is no intramuscular or abdominal wall lesion. IMPRESSION: 1. Multiple descending colonic and sigmoid colonic diverticula without diverticulitis. No bowel obstruction. No abscess in the abdomen or pelvis. Appendix appears normal. 2. No renal or ureteral calculi. No hydronephrosis. Urinary bladder wall thickness is within normal limits. 3.  Prominent spleen without focal splenic lesion evident. 4.  Aortic atherosclerosis. Electronically Signed   By: Lowella Grip III M.D.   On: 03/02/2018 14:13   DG ABD ACUTE 2+V W 1V CHEST  Result Date: 03/02/2018 CLINICAL DATA:  Acute periumbilical abdominal pain. EXAM: DG ABDOMEN ACUTE W/ 1V CHEST COMPARISON:  None. FINDINGS: There is no evidence of dilated bowel loops or free intraperitoneal air. No radiopaque calculi or other  significant radiographic abnormality is seen. Heart size and mediastinal contours are within normal limits. Both lungs are clear. IMPRESSION: No evidence of bowel obstruction or ileus. No acute cardiopulmonary disease. Electronically Signed   By: Marijo Conception, M.D.   On: 03/02/2018 09:14    Assessment & Plan:   Celia was seen today for hypertension.  Diagnoses and all orders for this visit:  DOE (dyspnea on exertion)- His exam, labs, and EKG are reassuring.  I recommended that he undergo a cardiac CT to screen for coronary atherosclerosis. -     EKG 12-Lead -     CBC with Differential/Platelet; Future -     Troponin I (High Sensitivity); Future -     Brain natriuretic peptide; Future -     Brain natriuretic peptide -     Troponin I (High Sensitivity) -     CBC with Differential/Platelet -     CT CARDIAC SCORING; Future  Chest tightness- See above. -     EKG 12-Lead -     Troponin I (High Sensitivity); Future -     Brain natriuretic peptide; Future -     Brain natriuretic peptide -     Troponin I (High Sensitivity) -     CT CARDIAC SCORING; Future  Numbness and tingling of right upper and lower extremity- He has a vague sensation of numbness and tingling in his right upper and right lower extremities.  He is neurologically intact.  If the symptoms persist then I will order an electrical study. -     CBC with Differential/Platelet; Future -     Vitamin B12; Future -     Folate; Future -     Folate -     Vitamin B12 -     CBC with Differential/Platelet  Other orders -     Flu Vaccine QUAD 6+ mos PF IM (Fluarix Quad PF)   I am having Alvester Chou [employer] maintain his Multiple Vitamin (ONE-A-DAY MENS PO), atorvastatin, Mesalamine, and clobetasol ointment.  No orders of the defined types were placed in this encounter.    Follow-up: Return in about 3 months (around 03/16/2020).  Scarlette Calico, MD

## 2019-12-24 ENCOUNTER — Ambulatory Visit (INDEPENDENT_AMBULATORY_CARE_PROVIDER_SITE_OTHER)
Admission: RE | Admit: 2019-12-24 | Discharge: 2019-12-24 | Disposition: A | Discharge status: Home / Self Care | Payer: Self-pay | Source: Ambulatory Visit | Attending: Internal Medicine | Admitting: Internal Medicine

## 2019-12-24 ENCOUNTER — Encounter: Payer: Self-pay | Admitting: Internal Medicine

## 2019-12-24 ENCOUNTER — Other Ambulatory Visit: Payer: Self-pay

## 2019-12-24 ENCOUNTER — Other Ambulatory Visit: Payer: Self-pay | Admitting: Internal Medicine

## 2019-12-24 DIAGNOSIS — R0789 Other chest pain: Secondary | ICD-10-CM

## 2019-12-24 DIAGNOSIS — R0600 Dyspnea, unspecified: Secondary | ICD-10-CM

## 2019-12-24 DIAGNOSIS — R0609 Other forms of dyspnea: Secondary | ICD-10-CM

## 2019-12-24 MED ORDER — ASPIRIN EC 81 MG PO TBEC: 81.0000 mg | DELAYED_RELEASE_TABLET | Freq: Every day | ORAL | 1 refills | Status: AC

## 2020-01-01 NOTE — Addendum Note (Signed)
Addended by: Janith Lima on: 01/01/2020 04:34 PM   Modules accepted: Orders

## 2020-01-03 ENCOUNTER — Telehealth (HOSPITAL_COMMUNITY): Payer: Self-pay

## 2020-01-03 NOTE — Telephone Encounter (Signed)
Detailed instructions left on the patient's answering machine. Asked to call back with any questions. S.Delani Kohli EMTP 

## 2020-01-08 ENCOUNTER — Other Ambulatory Visit: Payer: Self-pay

## 2020-01-08 ENCOUNTER — Ambulatory Visit (HOSPITAL_COMMUNITY): Payer: BC Managed Care – PPO | Attending: Cardiology

## 2020-01-08 DIAGNOSIS — R0789 Other chest pain: Secondary | ICD-10-CM | POA: Insufficient documentation

## 2020-01-08 DIAGNOSIS — R0609 Other forms of dyspnea: Secondary | ICD-10-CM

## 2020-01-08 DIAGNOSIS — R0600 Dyspnea, unspecified: Secondary | ICD-10-CM | POA: Diagnosis not present

## 2020-01-08 LAB — MYOCARDIAL PERFUSION IMAGING
LV dias vol: 109 mL (ref 62–150)
LV sys vol: 43 mL
Peak HR: 94 {beats}/min
Rest HR: 65 {beats}/min
SDS: 0
SRS: 0
SSS: 0
TID: 1.14

## 2020-01-08 MED ORDER — TECHNETIUM TC 99M TETROFOSMIN IV KIT
30.9000 | PACK | Freq: Once | INTRAVENOUS | Status: AC | PRN
  Administered 2020-01-08: 30.9 via INTRAVENOUS
  Filled 2020-01-08: qty 31

## 2020-01-08 MED ORDER — REGADENOSON 0.4 MG/5ML IV SOLN
0.4000 mg | Freq: Once | INTRAVENOUS | Status: AC
  Administered 2020-01-08: 0.4 mg via INTRAVENOUS

## 2020-01-08 MED ORDER — TECHNETIUM TC 99M TETROFOSMIN IV KIT
10.2000 | PACK | Freq: Once | INTRAVENOUS | Status: AC | PRN
  Administered 2020-01-08: 10.2 via INTRAVENOUS
  Filled 2020-01-08: qty 11

## 2020-01-29 ENCOUNTER — Other Ambulatory Visit: Payer: Self-pay | Admitting: Internal Medicine

## 2020-01-29 DIAGNOSIS — E785 Hyperlipidemia, unspecified: Secondary | ICD-10-CM

## 2020-07-26 ENCOUNTER — Other Ambulatory Visit: Payer: Self-pay | Admitting: Internal Medicine

## 2020-07-26 DIAGNOSIS — E785 Hyperlipidemia, unspecified: Secondary | ICD-10-CM

## 2020-08-04 ENCOUNTER — Telehealth: Payer: Self-pay | Admitting: Gastroenterology

## 2020-08-04 MED ORDER — MESALAMINE 800 MG PO TBEC: 1600.0000 mg | DELAYED_RELEASE_TABLET | Freq: Two times a day (BID) | ORAL | 0 refills | Status: DC

## 2020-08-04 NOTE — Telephone Encounter (Signed)
Tried to reach patient again this afternoon, unable to reach pt. 1 month supply of Mesalamine sent to pharmacy. Pt will need to schedule office visit to receive further refills.

## 2020-08-04 NOTE — Telephone Encounter (Signed)
Inbound call from patient. States he need medication refill that he says have been denied.

## 2020-08-04 NOTE — Telephone Encounter (Addendum)
Left message for pt advising that he will need to schedule office follow-up with Dr. Loletha Carrow before refills can be given.

## 2020-09-05 ENCOUNTER — Telehealth: Payer: Self-pay | Admitting: Gastroenterology

## 2020-09-05 ENCOUNTER — Other Ambulatory Visit: Payer: Self-pay

## 2020-09-05 MED ORDER — MESALAMINE 800 MG PO TBEC: 1600.0000 mg | DELAYED_RELEASE_TABLET | Freq: Two times a day (BID) | ORAL | 0 refills | Status: DC

## 2020-09-05 NOTE — Telephone Encounter (Signed)
Pt just made an appt to see APP on 9/15 and is requesting rf for mesalamine sent to [facility] on [address].

## 2020-09-05 NOTE — Telephone Encounter (Signed)
30 days supply given only. Will need to keep follow up for additional refills.

## 2020-09-06 ENCOUNTER — Other Ambulatory Visit: Payer: Self-pay | Admitting: Gastroenterology

## 2020-09-18 DIAGNOSIS — H524 Presbyopia: Secondary | ICD-10-CM | POA: Diagnosis not present

## 2020-09-18 DIAGNOSIS — H5203 Hypermetropia, bilateral: Secondary | ICD-10-CM | POA: Diagnosis not present

## 2020-09-18 DIAGNOSIS — H52223 Regular astigmatism, bilateral: Secondary | ICD-10-CM | POA: Diagnosis not present

## 2020-09-25 ENCOUNTER — Ambulatory Visit: Payer: BC Managed Care – PPO | Admitting: Nurse Practitioner

## 2020-09-25 ENCOUNTER — Encounter: Payer: Self-pay | Admitting: Nurse Practitioner

## 2020-09-25 VITALS — BP 124/70 | HR 80 | Ht 73.0 in | Wt 193.0 lb

## 2020-09-25 DIAGNOSIS — K5010 Crohn's disease of large intestine without complications: Secondary | ICD-10-CM | POA: Diagnosis not present

## 2020-09-25 MED ORDER — MESALAMINE 800 MG PO TBEC: 1600.0000 mg | DELAYED_RELEASE_TABLET | Freq: Two times a day (BID) | ORAL | 3 refills | Status: DC

## 2020-09-25 NOTE — Progress Notes (Signed)
09/25/2020 Daniel Allen [id number] 21-Feb-1960   Chief Complaint: Crohn's follow-up, Mesalamine refill  History of Present Illness: Daniel Allen is a 60 year old male with a past medical history of hyperlipidemia, diverticulosis and Crohn's colitis was initially diagnosed 20 to 25 years ago.  He presents to our office today for his annual review.  He remains on Mesalamine 800 mg 2 tabs p.o. twice daily.  He denies having any abdominal pain.  He is passing a normal formed brown bowel movement daily.  No GERD symptoms.  His appetite is good and his weight is stable.  He received COVID vaccinations x2 and a booster.  He takes ASA 81 mg daily, no other NSAIDs.  His most recent colonoscopy was 10/18/2019 which identified mildly active Crohn's colitis without dysplasia, see full procedure report and biopsy results below.  He is due for repeat colonoscopy 10/2021.  No known family history of IBD or colorectal cancer.  Labs 08/01/2019: BUN 16.  Creatinine 1.01 (repeat Cr level 0.98 on 10/7/202).  AST 20.  ALT 21.  Total bili 0.5. Labs 12/17/2019: WBC 8.1.  Hemoglobin 15.  Platelet 204.  Colonoscopy 10/18/2019: - The examined portion of the ileum was normal. - Diverticulosis in the left colon. - Normal mucosa in the entire examined colon. Biopsied. - The examination was otherwise normal on direct and retroflexion views. - Recall colonoscopy 2 years  1. Surgical [P], colon, cecum - MILDLY ACTIVE CHRONIC COLITIS, SEE NOTE - NEGATIVE FOR DYSPLASIA 2. Surgical [P], colon, 70cm - MILDLY ACTIVE CHRONIC COLITIS WITH A SINGLE NONNECROTIZING EPITHELIOID CELL MICROGRANULOMA, SEE NOTE - NEGATIVE FOR DYSPLASIA 3. Surgical [P], colon, 60cm - MILDLY ACTIVE CHRONIC COLITIS, SEE NOTE - NEGATIVE FOR DYSPLASIA 4. Surgical [P], colon, 50cm - MILDLY ACTIVE CHRONIC COLITIS, SEE NOTE - NEGATIVE FOR DYSPLASIA 5. Surgical [P], colon, 40cm - MILDLY ACTIVE CHRONIC COLITIS, SEE NOTE - NEGATIVE FOR DYSPLASIA 6.  Surgical [P], colon, 30cm - MILDLY ACTIVE CHRONIC COLITIS, SEE NOTE - NEGATIVE FOR DYSPLASIA 7. Surgical [P], colon, 20cm - MILDLY ACTIVE CHRONIC COLITIS, SEE NOTE - NEGATIVE FOR DYSPLASIA 8. Surgical [P], colon, 10cm - COLONIC MUCOSA WITH NO SPECIFIC HISTOPATHOLOGIC CHANGES. SEE NOTE - NEGATIVE FOR ACUTE INFLAMMATION, FEATURES OF CHRONICITY, GRANULOMAS OR DYSPLASIA  Past Medical History:  Diagnosis Date   Diverticulosis of colon    History of basal cell carcinoma excision    NOSE--  Feb 2017   History of colitis    crohn's colitis   Hyperlipidemia    PAC (premature atrial contraction)    Recurrent right inguinal hernia    Treadmill stress test negative for angina pectoris    03-13-2015 (dr skains)  no evidence of exercised induced ischemia and normal BP response   Wears glasses    Past Surgical History:  Procedure Laterality Date   COLONOSCOPY   09-29-2012, 08/11/2018   INGUINAL HERNIA REPAIR Right 2000   INGUINAL HERNIA REPAIR Right 04/15/2015   Procedure: LAPAROSCOPIC RIGHT  INGUINAL HERNIA;  Surgeon: Mickeal Skinner, MD;  Location: [facility];  Service: General;  Laterality: Right;   INSERTION OF MESH Right 04/15/2015   Procedure: INSERTION OF MESH;  Surgeon: Arta Bruce Kinsinger, MD;  Location: [facility];  Service: General;  Laterality: Right;   Current Outpatient Medications on File Prior to Visit  Medication Sig Dispense Refill   aspirin EC 81 MG tablet Take 1 tablet (81 mg total) by mouth daily. 90 tablet 1   atorvastatin (LIPITOR) 10  MG tablet TAKE 1 TABLET(10 MG) BY MOUTH DAILY AT 6 PM 90 tablet 1   Multiple Vitamin (ONE-A-DAY MENS PO) Take by mouth.     No current facility-administered medications on file prior to visit.   No Known Allergies  Current Medications, Allergies, Past Medical History, Past Surgical History, Family History and Social History were reviewed in [employer] record.   Review of  Systems:   Constitutional: Negative for fever, sweats, chills or weight loss.  Respiratory: Negative for shortness of breath.   Cardiovascular: Negative for chest pain, palpitations and leg swelling.  Gastrointestinal: See HPI.  Musculoskeletal: Negative for back pain or muscle aches.  Neurological: Negative for dizziness, headaches or paresthesias.    Physical Exam: BP 124/70   Pulse 80   Ht 6' 1"  (1.854 m)   Wt 193 lb (87.5 kg)   BMI 25.46 kg/m  General: 60 year old male in no acute distress. Head: Normocephalic and atraumatic. Eyes: No scleral icterus. Conjunctiva pink . Ears: Normal auditory acuity. Mouth: Dentition intact. No ulcers or lesions.  Lungs: Clear throughout to auscultation. Heart: Regular rate and rhythm, no murmur. Abdomen: Soft, nontender and nondistended. No masses or hepatomegaly. Normal bowel sounds x 4 quadrants.  Rectal: Deferred. Musculoskeletal: Symmetrical with no gross deformities. Extremities: No edema. Neurological: Alert oriented x 4. No focal deficits.  Psychological: Alert and cooperative. Normal mood and affect  Assessment and Recommendations:  63) 60 year old male with Crohn's disease well-controlled on mesalamine 1600 mg twice daily. -Refill Mesalamine 800 mg 2 p.o. twice daily 90-day supply with 3 additional refills -Patient is on ASA 81 mg daily, advised to avoid all other NSAIDs -Advised flu shot with PCP within the next few weeks -Mended CBC and CMP, patient did not wish to have these labs completed in our office today but will complete at the time of his annual physical with Dr. Scarlette Calico -In the office in 1 year and as needed

## 2020-09-25 NOTE — Patient Instructions (Signed)
If you are age 60 or older, your body mass index should be between 23-30. Your Body mass index is 25.46 kg/m. If this is out of the aforementioned range listed, please consider follow up with your Primary Care Provider.  If you are age 46 or younger, your body mass index should be between 19-25. Your Body mass index is 25.46 kg/m. If this is out of the aformentioned range listed, please consider follow up with your Primary Care Provider.   The [facility] providers would like to encourage you to use [facility] to communicate with providers for non-urgent requests or questions.  Due to long hold times on the telephone, sending your provider a message by [facility] may be faster and more efficient way to get a response. Please allow 48 business hours for a response.  Please remember that this is for non-urgent requests/questions.  We have refilled your medication with a year supply.  It was great seeing you today! Thank you for entrusting me with your care and choosing [facility].  Noralyn Pick, CRNP

## 2020-09-29 NOTE — Progress Notes (Signed)
____________________________________________________________  Attending physician addendum:  Thank you for sending this case to me. I have reviewed the entire note and agree with the plan.  Annual Bun/Cr recommended when on mesalamine due to rare risk of nephritis. Lats check Oct 2021.  This patient's renal function has remained stable many years on this medicine.  Thank you for offering the labs to him, he certainly needs to have them done with regular PCP visits.  Wilfrid Lund, MD  ____________________________________________________________

## 2020-11-05 DIAGNOSIS — D2261 Melanocytic nevi of right upper limb, including shoulder: Secondary | ICD-10-CM | POA: Diagnosis not present

## 2020-11-05 DIAGNOSIS — D2262 Melanocytic nevi of left upper limb, including shoulder: Secondary | ICD-10-CM | POA: Diagnosis not present

## 2020-11-05 DIAGNOSIS — Z85828 Personal history of other malignant neoplasm of skin: Secondary | ICD-10-CM | POA: Diagnosis not present

## 2020-11-05 DIAGNOSIS — D1801 Hemangioma of skin and subcutaneous tissue: Secondary | ICD-10-CM | POA: Diagnosis not present

## 2020-12-02 ENCOUNTER — Telehealth: Payer: Self-pay | Admitting: Nurse Practitioner

## 2020-12-03 NOTE — Telephone Encounter (Signed)
Melissa, pls contact patient next week and inquire if he had his routine labs done with his PCP or not. At the time of his office appt 09/2020 he stated he would see his PCP for routine labs ie: CBC and CMP.  If these labs were not done, if he does not have a physical exam scheduled with his PCP in the next month or two, pls see if he is willing to go to our lab to have a CBC and CMP done. Thx

## 2020-12-15 ENCOUNTER — Other Ambulatory Visit: Payer: Self-pay | Admitting: Internal Medicine

## 2020-12-15 DIAGNOSIS — I7121 Aneurysm of the ascending aorta, without rupture: Secondary | ICD-10-CM

## 2020-12-25 ENCOUNTER — Encounter: Payer: Self-pay | Admitting: Internal Medicine

## 2020-12-25 ENCOUNTER — Ambulatory Visit (INDEPENDENT_AMBULATORY_CARE_PROVIDER_SITE_OTHER): Payer: BC Managed Care – PPO | Admitting: Internal Medicine

## 2020-12-25 ENCOUNTER — Ambulatory Visit: Payer: BC Managed Care – PPO

## 2020-12-25 ENCOUNTER — Other Ambulatory Visit: Payer: Self-pay

## 2020-12-25 ENCOUNTER — Ambulatory Visit (INDEPENDENT_AMBULATORY_CARE_PROVIDER_SITE_OTHER): Payer: BC Managed Care – PPO

## 2020-12-25 VITALS — BP 134/86 | HR 72 | Temp 97.9°F | Ht 73.0 in | Wt 190.0 lb

## 2020-12-25 DIAGNOSIS — Z0000 Encounter for general adult medical examination without abnormal findings: Secondary | ICD-10-CM | POA: Diagnosis not present

## 2020-12-25 DIAGNOSIS — E785 Hyperlipidemia, unspecified: Secondary | ICD-10-CM

## 2020-12-25 DIAGNOSIS — R1033 Periumbilical pain: Secondary | ICD-10-CM | POA: Insufficient documentation

## 2020-12-25 DIAGNOSIS — R079 Chest pain, unspecified: Secondary | ICD-10-CM | POA: Diagnosis not present

## 2020-12-25 DIAGNOSIS — K5090 Crohn's disease, unspecified, without complications: Secondary | ICD-10-CM | POA: Diagnosis not present

## 2020-12-25 DIAGNOSIS — Z23 Encounter for immunization: Secondary | ICD-10-CM

## 2020-12-25 DIAGNOSIS — R1013 Epigastric pain: Secondary | ICD-10-CM | POA: Diagnosis not present

## 2020-12-25 DIAGNOSIS — K219 Gastro-esophageal reflux disease without esophagitis: Secondary | ICD-10-CM

## 2020-12-25 DIAGNOSIS — I491 Atrial premature depolarization: Secondary | ICD-10-CM | POA: Diagnosis not present

## 2020-12-25 DIAGNOSIS — R072 Precordial pain: Secondary | ICD-10-CM | POA: Diagnosis not present

## 2020-12-25 DIAGNOSIS — Z0001 Encounter for general adult medical examination with abnormal findings: Secondary | ICD-10-CM | POA: Insufficient documentation

## 2020-12-25 LAB — LIPID PANEL
Cholesterol: 161 mg/dL (ref 0–200)
HDL: 54.8 mg/dL (ref >39.00)
LDL Cholesterol: 80 mg/dL (ref 0–99)
NonHDL: 106.63
Total CHOL/HDL Ratio: 3
Triglycerides: 132 mg/dL (ref 0.0–149.0)
VLDL: 26.4 mg/dL (ref 0.0–40.0)

## 2020-12-25 LAB — LIPASE: Lipase: 32 U/L (ref 11.0–59.0)

## 2020-12-25 LAB — URINALYSIS, ROUTINE W REFLEX MICROSCOPIC
Bilirubin Urine: NEGATIVE
Hgb urine dipstick: NEGATIVE
Ketones, ur: NEGATIVE
Leukocytes,Ua: NEGATIVE
Nitrite: NEGATIVE
RBC / HPF: NONE SEEN (ref 0–?)
Specific Gravity, Urine: 1.025 (ref 1.000–1.030)
Total Protein, Urine: NEGATIVE
Urine Glucose: NEGATIVE
Urobilinogen, UA: 0.2 (ref 0.0–1.0)
pH: 6 (ref 5.0–8.0)

## 2020-12-25 LAB — BASIC METABOLIC PANEL
BUN: 16 mg/dL (ref 6–23)
CO2: 29 mEq/L (ref 19–32)
Calcium: 9.9 mg/dL (ref 8.4–10.5)
Chloride: 103 mEq/L (ref 96–112)
Creatinine, Ser: 0.96 mg/dL (ref 0.40–1.50)
GFR: 85.97 mL/min (ref >60.00)
Glucose, Bld: 84 mg/dL (ref 70–99)
Potassium: 4.3 mEq/L (ref 3.5–5.1)
Sodium: 140 mEq/L (ref 135–145)

## 2020-12-25 LAB — CBC WITH DIFFERENTIAL/PLATELET
Basophils Absolute: 0.1 10*3/uL (ref 0.0–0.1)
Basophils Relative: 0.7 % (ref 0.0–3.0)
Eosinophils Absolute: 0.2 10*3/uL (ref 0.0–0.7)
Eosinophils Relative: 2.1 % (ref 0.0–5.0)
HCT: 46.1 % (ref 39.0–52.0)
Hemoglobin: 15.6 g/dL (ref 13.0–17.0)
Lymphocytes Relative: 17.2 % (ref 12.0–46.0)
Lymphs Abs: 1.6 10*3/uL (ref 0.7–4.0)
MCHC: 33.8 g/dL (ref 30.0–36.0)
MCV: 91.3 fl (ref 78.0–100.0)
Monocytes Absolute: 0.5 10*3/uL (ref 0.1–1.0)
Monocytes Relative: 5.7 % (ref 3.0–12.0)
Neutro Abs: 7.1 10*3/uL (ref 1.4–7.7)
Neutrophils Relative %: 74.3 % (ref 43.0–77.0)
Platelets: 228 10*3/uL (ref 150.0–400.0)
RBC: 5.05 Mil/uL (ref 4.22–5.81)
RDW: 13.2 % (ref 11.5–15.5)
WBC: 9.5 10*3/uL (ref 4.0–10.5)

## 2020-12-25 LAB — HEPATIC FUNCTION PANEL
ALT: 23 U/L (ref 0–53)
AST: 20 U/L (ref 0–37)
Albumin: 4.9 g/dL (ref 3.5–5.2)
Alkaline Phosphatase: 66 U/L (ref 39–117)
Bilirubin, Direct: 0.1 mg/dL (ref 0.0–0.3)
Total Bilirubin: 0.7 mg/dL (ref 0.2–1.2)
Total Protein: 7.6 g/dL (ref 6.0–8.3)

## 2020-12-25 LAB — C-REACTIVE PROTEIN: CRP: <1 mg/dL (ref 0.5–20.0)

## 2020-12-25 LAB — D-DIMER, QUANTITATIVE: D-Dimer, Quant: <0.19 mcg/mL FEU (ref <0.50)

## 2020-12-25 LAB — TROPONIN I (HIGH SENSITIVITY): High Sens Troponin I: 4 ng/L (ref 2–17)

## 2020-12-25 LAB — PSA: PSA: 1.98 ng/mL (ref 0.10–4.00)

## 2020-12-25 LAB — TSH: TSH: 1.99 u[IU]/mL (ref 0.35–5.50)

## 2020-12-25 MED ORDER — ATORVASTATIN CALCIUM 10 MG PO TABS: ORAL_TABLET | ORAL | 1 refills | Status: DC

## 2020-12-25 NOTE — Progress Notes (Signed)
Subjective:  Patient ID: Daniel Allen, male    DOB: 06-02-1960  Age: 60 y.o. MRN: [id number]  CC: Annual Exam, Abdominal Pain, and Chest Pain  This visit occurred during the SARS-CoV-2 public health emergency.  Safety protocols were in place, including screening questions prior to the visit, additional usage of staff PPE, and extensive cleaning of exam room while observing appropriate contact time as indicated for disinfecting solutions.    HPI Daniel Allen presents for a CPX and f/up -   He has felt poorly for 3 weeks.  He has had a vague sensation of discomfort under his sternum with gas, burping, and dyspepsia.  His bowel movements have been small and more frequent but he has not noticed any bright red blood per rectum or melena.  He saw a nurse practitioner elsewhere about 3 days ago and has been taking Pepcid and says his symptoms have improved.  He has had a vague sensation of discomfort throughout his abdomen.  His appetite has been good.  He denies cough, wheezing, diaphoresis, shortness of breath, or near syncope.  He has an upcoming CT scan to monitor a thoracic aortic aneurysm.  Outpatient Medications Prior to Visit  Medication Sig Dispense Refill   aspirin EC 81 MG tablet Take 1 tablet (81 mg total) by mouth daily. 90 tablet 1   famotidine (PEPCID) 40 MG tablet 1 tablet at bedtime Orally Once a day for 30 day(s)     Mesalamine 800 MG TBEC Take 2 tablets (1,600 mg total) by mouth 2 (two) times daily. 360 tablet 3   Multiple Vitamin (ONE-A-DAY MENS PO) Take by mouth.     atorvastatin (LIPITOR) 10 MG tablet TAKE 1 TABLET(10 MG) BY MOUTH DAILY AT 6 PM 90 tablet 1   No facility-administered medications prior to visit.    ROS Review of Systems  Constitutional:  Negative for appetite change, chills, diaphoresis, fatigue, fever and unexpected weight change.  HENT: Negative.  Negative for sore throat and trouble swallowing.   Eyes: Negative.   Respiratory:  Negative for cough,  chest tightness, shortness of breath and wheezing.   Cardiovascular:  Positive for chest pain. Negative for palpitations and leg swelling.  Gastrointestinal:  Positive for abdominal pain. Negative for abdominal distention, blood in stool, constipation, diarrhea, nausea and vomiting.  Endocrine: Negative.   Genitourinary: Negative.  Negative for difficulty urinating, flank pain, hematuria, scrotal swelling and testicular pain.  Musculoskeletal: Negative.  Negative for back pain and myalgias.  Skin: Negative.   Neurological: Negative.  Negative for dizziness, weakness, light-headedness and numbness.  Hematological:  Negative for adenopathy. Does not bruise/bleed easily.  Psychiatric/Behavioral: Negative.     Objective:  BP 134/86 (BP Location: Left Arm, Patient Position: Sitting, Cuff Size: Large)    Pulse 72    Temp 97.9 F (36.6 C) (Oral)    Ht 6' 1"  (1.854 m)    Wt 190 lb (86.2 kg)    SpO2 99%    BMI 25.07 kg/m   BP Readings from Last 3 Encounters:  12/25/20 134/86  09/25/20 124/70  12/17/19 (!) 144/86    Wt Readings from Last 3 Encounters:  12/25/20 190 lb (86.2 kg)  09/25/20 193 lb (87.5 kg)  01/08/20 193 lb (87.5 kg)    Physical Exam Vitals reviewed.  HENT:     Nose: Nose normal.     Mouth/Throat:     Mouth: Mucous membranes are moist.  Eyes:     General: No scleral icterus.  Conjunctiva/sclera: Conjunctivae normal.  Cardiovascular:     Rate and Rhythm: Normal rate and regular rhythm.     Heart sounds: Normal heart sounds, S1 normal and S2 normal. No murmur heard.   No friction rub. No gallop.     Comments: EKG- NSR, 68 bpm Normal EKG Pulmonary:     Effort: Pulmonary effort is normal.     Breath sounds: No stridor. No wheezing, rhonchi or rales.  Abdominal:     General: Abdomen is flat. Bowel sounds are normal. There is no distension.     Palpations: Abdomen is soft. There is no hepatomegaly, splenomegaly or mass.     Tenderness: There is no abdominal  tenderness.     Hernia: There is no hernia in the left inguinal area or right inguinal area.  Genitourinary:    Pubic Area: No rash.      Penis: Normal and circumcised.      Testes: Normal.        Right: Mass, tenderness or swelling not present.        Left: Mass, tenderness or swelling not present.     Epididymis:     Right: Normal. Not inflamed or enlarged.     Left: Normal. Not inflamed or enlarged.     Prostate: Normal. Not enlarged, not tender and no nodules present.     Rectum: Normal. Guaiac result negative. No mass, tenderness, anal fissure, external hemorrhoid or internal hemorrhoid. Normal anal tone.  Musculoskeletal:     Cervical back: Neck supple.     Right lower leg: No edema.     Left lower leg: No edema.  Lymphadenopathy:     Cervical: No cervical adenopathy.     Lower Body: No right inguinal adenopathy. No left inguinal adenopathy.  Neurological:     Mental Status: He is alert.    Lab Results  Component Value Date   WBC 9.5 12/25/2020   HGB 15.6 12/25/2020   HCT 46.1 12/25/2020   PLT 228.0 12/25/2020   GLUCOSE 84 12/25/2020   CHOL 161 12/25/2020   TRIG 132.0 12/25/2020   HDL 54.80 12/25/2020   LDLDIRECT 162.9 09/14/2013   LDLCALC 80 12/25/2020   ALT 23 12/25/2020   AST 20 12/25/2020   NA 140 12/25/2020   K 4.3 12/25/2020   CL 103 12/25/2020   CREATININE 0.96 12/25/2020   BUN 16 12/25/2020   CO2 29 12/25/2020   TSH 1.99 12/25/2020   PSA 1.98 12/25/2020   HGBA1C 5.3 08/01/2019    CT CARDIAC SCORING  Addendum Date: 12/24/2019   ADDENDUM REPORT: 12/24/2019 12:19 CLINICAL DATA:  Risk stratification EXAM: Coronary Calcium Score TECHNIQUE: The patient was scanned on a [employer] scanner. Axial non-contrast 3 mm slices were carried out through the heart. The data set was analyzed on a dedicated work station and scored using the Melbeta. FINDINGS: Non-cardiac: See separate report from [facility]. Ascending Aorta: Mild ascending aortic  aneurysm meausring 42 x 41cm. Pericardium: Normal Coronary arteries: Normal coronary origins with heavy coronary calcifications of the LAD and RCA. IMPRESSION: Coronary calcium score of 742. This was 95th percentile for age and sex matched control. Mild ascending aortic aneurysm 42 x 41cm. Preventive therapy and risk factor modification recommended. Consider further non invasive testing, Traci Turner Electronically Signed   By: Fransico Him   On: 12/24/2019 12:19   Result Date: 12/24/2019 EXAM: OVER-READ INTERPRETATION  CT CHEST The following report is an over-read performed by radiologist Dr. Vinnie Langton of [facility] on 12/24/2019. This over-read does not include interpretation of cardiac or coronary anatomy or pathology. The coronary calcium score interpretation by the cardiologist is attached. COMPARISON:  None. FINDINGS: Ectasia of the proximal ascending thoracic aorta (4.3 cm in diameter). Within the visualized portions of the thorax there are no suspicious appearing pulmonary nodules or masses, there is no acute consolidative airspace disease, no pleural effusions, no pneumothorax and no lymphadenopathy. Visualized portions of the upper abdomen are unremarkable. There are no aggressive appearing lytic or blastic lesions noted in the visualized portions of the skeleton. IMPRESSION: 1. Ectasia of proximal ascending thoracic aorta (4.3 cm in diameter). Recommend annual imaging followup by CTA or MRA. This recommendation follows 2010 ACCF/AHA/AATS/ACR/ASA/SCA/SCAI/SIR/STS/SVM Guidelines for the Diagnosis and Management of Patients with Thoracic Aortic Disease. Circulation. 2010; 121: K800-L491. Aortic aneurysm NOS (ICD10-I71.9) Electronically Signed: By: Vinnie Langton M.D. On: 12/24/2019 10:08   DG ABD ACUTE 2+V W 1V CHEST  Result Date: 12/25/2020 CLINICAL DATA:  Periumbilical abdominal pain. Substernal chest pain. EXAM: DG ABDOMEN ACUTE WITH 1 VIEW CHEST COMPARISON:  03/02/2018  FINDINGS: There is no evidence of dilated bowel loops or free intraperitoneal air. No radiopaque calculi or other significant radiographic abnormality is seen. Heart size and mediastinal contours are within normal limits. Both lungs are clear. IMPRESSION: Negative abdominal radiographs.  No acute cardiopulmonary disease. Electronically Signed   By: Markus Daft M.D.   On: 12/25/2020 11:30     Assessment & Plan:   Dontel was seen today for annual exam, abdominal pain and chest pain.  Diagnoses and all orders for this visit:  Periumbilical abdominal pain- He has vague symptoms that sound like dyspepsia.  The symptoms have improved with an H2 blocker.  He has a paucity of other symptoms.  His exam, labs, and x-ray are reassuring.  Will continue to treat for dyspepsia with the H2 blocker. -     Lipase; Future -     Urinalysis, Routine w reflex microscopic; Future -     Hepatic function panel; Future -     C-reactive protein; Future -     DG ABD ACUTE 2+V W 1V CHEST; Future -     C-reactive protein -     Hepatic function panel -     Lipase -     Urinalysis, Routine w reflex microscopic  Hyperlipidemia LDL goal <100- LDL goal achieved. Doing well on the statin  -     atorvastatin (LIPITOR) 10 MG tablet; TAKE 1 TABLET(10 MG) BY MOUTH DAILY AT 6 PM Strength: 10 mg -     TSH; Future -     Hepatic function panel; Future -     Hepatic function panel -     TSH  Substernal chest pain- This is c/w GERD.  His exam, labs, x-ray, and EKG are all reassuring.  He will undergo a CT of the aorta next week. -     D-dimer, quantitative; Future -     Troponin I (High Sensitivity); Future -     DG ABD ACUTE 2+V W 1V CHEST; Future -     EKG 12-Lead -     D-dimer, quantitative -     Troponin I (High Sensitivity)  Encounter for general adult medical examination with abnormal findings- Exam completed, labs reviewed, vaccines reviewed and updated, cancer screenings are up-to-date, patient education was given. -      Lipid panel; Future -     PSA; Future -     Lipid  panel -     PSA  PAC (premature atrial contraction)- He has no symptoms related to this. -     Basic metabolic panel; Future -     TSH; Future -     Basic metabolic panel -     TSH  Gastroesophageal reflux disease without esophagitis- He is improving on the H2 blocker. -     CBC with Differential/Platelet; Future -     CBC with Differential/Platelet  Crohn's disease without complication, unspecified gastrointestinal tract location [facility])- I do not see any complications related to this. -     CBC with Differential/Platelet; Future -     Basic metabolic panel; Future -     Hepatic function panel; Future -     C-reactive protein; Future -     DG ABD ACUTE 2+V W 1V CHEST; Future -     C-reactive protein -     Basic metabolic panel -     CBC with Differential/Platelet -     Hepatic function panel  Need for vaccination -     Pneumococcal conjugate vaccine 20-valent (Prevnar 20)  Other orders -     Flu Vaccine QUAD 6+ mos PF IM (Fluarix Quad PF)  I have changed Alvester Chou C. Ilg's atorvastatin. I am also having him maintain his Multiple Vitamin (ONE-A-DAY MENS PO), aspirin EC, Mesalamine, and famotidine.  Meds ordered this encounter  Medications   atorvastatin (LIPITOR) 10 MG tablet    Sig: TAKE 1 TABLET(10 MG) BY MOUTH DAILY AT 6 PM Strength: 10 mg    Dispense:  90 tablet    Refill:  1     Follow-up: Return if symptoms worsen or fail to improve.  Scarlette Calico, MD

## 2020-12-25 NOTE — Patient Instructions (Signed)
Abdominal Pain, Adult Pain in the abdomen (abdominal pain) can be caused by many things. Often, abdominal pain is not serious and it gets better with no treatment or by being treated at home. However, sometimes abdominal pain is serious. Your health care provider will ask questions about your medical history and do a physical exam to try to determine the cause of your abdominal pain. Follow these instructions at home: Medicines Take over-the-counter and prescription medicines only as told by your health care provider. Do not take a laxative unless told by your health care provider. General instructions  Watch your condition for any changes. Drink enough fluid to keep your urine pale yellow. Keep all follow-up visits as told by your health care provider. This is important. Contact a health care provider if: Your abdominal pain changes or gets worse. You are not hungry or you lose weight without trying. You are constipated or have diarrhea for more than 2-3 days. You have pain when you urinate or have a bowel movement. Your abdominal pain wakes you up at night. Your pain gets worse with meals, after eating, or with certain foods. You are vomiting and cannot keep anything down. You have a fever. You have blood in your urine. Get help right away if: Your pain does not go away as soon as your health care provider told you to expect. You cannot stop vomiting. Your pain is only in areas of the abdomen, such as the right side or the left lower portion of the abdomen. Pain on the right side could be caused by appendicitis. You have bloody or black stools, or stools that look like tar. You have severe pain, cramping, or bloating in your abdomen. You have signs of dehydration, such as: Dark urine, very little urine, or no urine. Cracked lips. Dry mouth. Sunken eyes. Sleepiness. Weakness. You have trouble breathing or chest pain. Summary Often, abdominal pain is not serious and it gets  better with no treatment or by being treated at home. However, sometimes abdominal pain is serious. Watch your condition for any changes. Take over-the-counter and prescription medicines only as told by your health care provider. Contact a health care provider if your abdominal pain changes or gets worse. Get help right away if you have severe pain, cramping, or bloating in your abdomen. This information is not intended to replace advice given to you by your health care provider. Make sure you discuss any questions you have with your health care provider. Document Revised: 02/16/2019 Document Reviewed: 05/08/2018 Elsevier Patient Education  [place].

## 2021-01-01 ENCOUNTER — Other Ambulatory Visit: Payer: Self-pay

## 2021-01-01 ENCOUNTER — Ambulatory Visit (HOSPITAL_COMMUNITY)
Admission: RE | Admit: 2021-01-01 | Discharge: 2021-01-01 | Disposition: A | Discharge status: Home / Self Care | Payer: BC Managed Care – PPO | Source: Ambulatory Visit | Attending: Internal Medicine | Admitting: Internal Medicine

## 2021-01-01 DIAGNOSIS — I7121 Aneurysm of the ascending aorta, without rupture: Secondary | ICD-10-CM | POA: Insufficient documentation

## 2021-01-01 DIAGNOSIS — J9811 Atelectasis: Secondary | ICD-10-CM | POA: Diagnosis not present

## 2021-01-01 MED ORDER — IOHEXOL 350 MG/ML SOLN
80.0000 mL | Freq: Once | INTRAVENOUS | Status: AC | PRN
  Administered 2021-01-01: 15:00:00 80 mL via INTRAVENOUS

## 2021-04-10 DIAGNOSIS — E789 Disorder of lipoprotein metabolism, unspecified: Secondary | ICD-10-CM | POA: Diagnosis not present

## 2021-04-10 DIAGNOSIS — Z9189 Other specified personal risk factors, not elsewhere classified: Secondary | ICD-10-CM | POA: Diagnosis not present

## 2021-04-10 DIAGNOSIS — R931 Abnormal findings on diagnostic imaging of heart and coronary circulation: Secondary | ICD-10-CM | POA: Insufficient documentation

## 2021-04-10 DIAGNOSIS — R079 Chest pain, unspecified: Secondary | ICD-10-CM | POA: Diagnosis not present

## 2021-05-13 ENCOUNTER — Encounter: Payer: Self-pay | Admitting: Internal Medicine

## 2021-05-13 DIAGNOSIS — R079 Chest pain, unspecified: Secondary | ICD-10-CM | POA: Diagnosis not present

## 2021-05-13 DIAGNOSIS — Z9189 Other specified personal risk factors, not elsewhere classified: Secondary | ICD-10-CM | POA: Diagnosis not present

## 2021-07-09 DIAGNOSIS — H1031 Unspecified acute conjunctivitis, right eye: Secondary | ICD-10-CM | POA: Diagnosis not present

## 2021-07-29 ENCOUNTER — Other Ambulatory Visit: Payer: Self-pay | Admitting: Internal Medicine

## 2021-07-29 DIAGNOSIS — E785 Hyperlipidemia, unspecified: Secondary | ICD-10-CM

## 2021-09-16 DIAGNOSIS — E785 Hyperlipidemia, unspecified: Secondary | ICD-10-CM | POA: Diagnosis not present

## 2021-09-24 DIAGNOSIS — H524 Presbyopia: Secondary | ICD-10-CM | POA: Diagnosis not present

## 2021-09-24 DIAGNOSIS — H5203 Hypermetropia, bilateral: Secondary | ICD-10-CM | POA: Diagnosis not present

## 2021-09-24 DIAGNOSIS — H52223 Regular astigmatism, bilateral: Secondary | ICD-10-CM | POA: Diagnosis not present

## 2021-10-22 DIAGNOSIS — E785 Hyperlipidemia, unspecified: Secondary | ICD-10-CM | POA: Diagnosis not present

## 2021-10-22 DIAGNOSIS — Z9189 Other specified personal risk factors, not elsewhere classified: Secondary | ICD-10-CM | POA: Diagnosis not present

## 2021-10-22 DIAGNOSIS — Z789 Other specified health status: Secondary | ICD-10-CM | POA: Diagnosis not present

## 2021-10-22 DIAGNOSIS — E663 Overweight: Secondary | ICD-10-CM | POA: Diagnosis not present

## 2021-10-22 DIAGNOSIS — Z719 Counseling, unspecified: Secondary | ICD-10-CM | POA: Diagnosis not present

## 2021-11-03 ENCOUNTER — Encounter: Payer: Self-pay | Admitting: Gastroenterology

## 2021-11-13 ENCOUNTER — Encounter: Payer: Self-pay | Admitting: Gastroenterology

## 2021-11-13 DIAGNOSIS — L821 Other seborrheic keratosis: Secondary | ICD-10-CM | POA: Diagnosis not present

## 2021-11-13 DIAGNOSIS — D485 Neoplasm of uncertain behavior of skin: Secondary | ICD-10-CM | POA: Diagnosis not present

## 2021-11-13 DIAGNOSIS — D225 Melanocytic nevi of trunk: Secondary | ICD-10-CM | POA: Diagnosis not present

## 2021-11-13 DIAGNOSIS — D1801 Hemangioma of skin and subcutaneous tissue: Secondary | ICD-10-CM | POA: Diagnosis not present

## 2021-11-13 DIAGNOSIS — L570 Actinic keratosis: Secondary | ICD-10-CM | POA: Diagnosis not present

## 2021-11-13 DIAGNOSIS — Z85828 Personal history of other malignant neoplasm of skin: Secondary | ICD-10-CM | POA: Diagnosis not present

## 2021-11-13 DIAGNOSIS — L814 Other melanin hyperpigmentation: Secondary | ICD-10-CM | POA: Diagnosis not present

## 2021-11-30 ENCOUNTER — Other Ambulatory Visit: Payer: Self-pay | Admitting: Nurse Practitioner

## 2021-11-30 ENCOUNTER — Ambulatory Visit (AMBULATORY_SURGERY_CENTER): Payer: Self-pay

## 2021-11-30 VITALS — Ht 73.0 in | Wt 193.0 lb

## 2021-11-30 DIAGNOSIS — K5010 Crohn's disease of large intestine without complications: Secondary | ICD-10-CM

## 2021-11-30 MED ORDER — NA SULFATE-K SULFATE-MG SULF 17.5-3.13-1.6 GM/177ML PO SOLN: 1.0000 | Freq: Once | ORAL | 0 refills | Status: AC

## 2021-11-30 NOTE — Progress Notes (Signed)
No egg or soy allergy known to patient;  No issues known to pt with past sedation with any surgeries or procedures; Patient denies ever being told they had issues or difficulty with intubation; No FH of Malignant Hyperthermia; Pt is not on diet pills; Pt is not on home 02;  Pt is not on blood thinners;   Pt denies issues with constipation=patient reports he is having a bowel movement early in the morning and then another bowel movement in the afternoon that is "thinner" in structure, no change in consistency, color, or smell; patient reports he is not having any issues with constipation with respect to taking laxatives or stool softeners, he has increased oral intake, is active, and is eating fruits/veggies and is not straining with bowel movements; patient admits to having normal routine bowel movements but is also having another bowel movement later in the morning/early afternoon that is "thinner and looser";   No A fib or A flutter; Have any cardiac testing pending--NO Pt instructed to use Singlecare.com or GoodRx for a price reduction on prep;   Insurance verified during [place] appt=[employer]  Patient's chart reviewed by Osvaldo Angst CNRA prior to previsit and patient appropriate for the [facility].  Previsit completed and red dot placed by patient's name on their procedure day (on provider's schedule).    GoodRx coupon given to patient during [facility] appt;

## 2021-12-23 ENCOUNTER — Encounter: Payer: Self-pay | Admitting: Gastroenterology

## 2021-12-23 DIAGNOSIS — Z711 Person with feared health complaint in whom no diagnosis is made: Secondary | ICD-10-CM | POA: Diagnosis not present

## 2021-12-24 ENCOUNTER — Other Ambulatory Visit: Payer: Self-pay | Admitting: Internal Medicine

## 2021-12-24 DIAGNOSIS — I7121 Aneurysm of the ascending aorta, without rupture: Secondary | ICD-10-CM

## 2021-12-29 ENCOUNTER — Ambulatory Visit (AMBULATORY_SURGERY_CENTER): Payer: BC Managed Care – PPO | Admitting: Gastroenterology

## 2021-12-29 ENCOUNTER — Encounter: Payer: Self-pay | Admitting: Gastroenterology

## 2021-12-29 VITALS — BP 123/72 | HR 54 | Temp 97.8°F | Resp 12 | Ht 73.0 in | Wt 193.0 lb

## 2021-12-29 DIAGNOSIS — K5010 Crohn's disease of large intestine without complications: Secondary | ICD-10-CM | POA: Diagnosis not present

## 2021-12-29 DIAGNOSIS — K50118 Crohn's disease of large intestine with other complication: Secondary | ICD-10-CM | POA: Diagnosis not present

## 2021-12-29 MED ORDER — SODIUM CHLORIDE 0.9 % IV SOLN: 500.0000 mL | Freq: Once | INTRAVENOUS | Status: DC

## 2021-12-29 NOTE — Progress Notes (Signed)
Pt resting comfortably. VSS. Airway intact. SBAR complete to RN. All questions answered.

## 2021-12-29 NOTE — Progress Notes (Signed)
History and Physical:  This patient presents for endoscopic testing for: Encounter Diagnosis  Name Primary?   Crohn's disease of colon without complication [facility]) Yes    61 year old man here for dysplasia screening with a history of Crohn's disease.  Crohn's colitis diagnosed decades ago, and long-term endoscopic remission on low-dose mesalamine. Last colonoscopy October 2021, no neoplasia, no dysplasia on random biopsies.  Patient is otherwise without complaints or active issues today.   Past Medical History: Past Medical History:  Diagnosis Date   Diverticulosis of colon    GERD (gastroesophageal reflux disease)    on meds   History of basal cell carcinoma excision    NOSE--  Feb 2017   History of colitis    crohn's colitis   Hyperlipidemia    on meds   PAC (premature atrial contraction)    Recurrent right inguinal hernia    Treadmill stress test negative for angina pectoris    03-13-2015 (dr skains)  no evidence of exercised induced ischemia and normal BP response   Wears glasses      Past Surgical History: Past Surgical History:  Procedure Laterality Date   COLONOSCOPY  2021   HD-MAC-suprep(good)-tics/mildy active chronic colitis-2 yr recall   INGUINAL HERNIA REPAIR Right 2000   INGUINAL HERNIA REPAIR Right 04/15/2015   Procedure: LAPAROSCOPIC RIGHT  INGUINAL HERNIA;  Surgeon: Mickeal Skinner, MD;  Location: [facility];  Service: General;  Laterality: Right;   INSERTION OF MESH Right 04/15/2015   Procedure: INSERTION OF MESH;  Surgeon: Mickeal Skinner, MD;  Location: [facility];  Service: General;  Laterality: Right;    Allergies: Allergies  Allergen Reactions   Omeprazole Rash    Outpatient Meds: Current Outpatient Medications  Medication Sig Dispense Refill   aspirin EC 81 MG tablet Take 1 tablet (81 mg total) by mouth daily. 90 tablet 1   atorvastatin (LIPITOR) 10 MG tablet TAKE 1 TABLET BY MOUTH DAILY AT 6 PM 90  tablet 1   famotidine (PEPCID) 40 MG tablet 1 tablet at bedtime Orally Once a day for 30 day(s)     Mesalamine 800 MG TBEC TAKE 2 TABLETS(1600 MG) BY MOUTH TWICE DAILY 120 tablet 0   Multiple Vitamin (ONE-A-DAY MENS PO) Take 1 tablet by mouth daily at 6 (six) AM.     fluorouracil (EFUDEX) 5 % cream Apply 1 Application topically 2 (two) times daily.     Current Facility-Administered Medications  Medication Dose Route Frequency Provider Last Rate Last Admin   0.9 %  sodium chloride infusion  500 mL Intravenous Once Danis, Estill Cotta III, MD          ___________________________________________________________________ Objective   Exam:  BP 131/85   Pulse (!) 57   Temp 97.8 F (36.6 C) (Temporal)   Ht _0  (1.854 m)   Wt 193 lb (87.5 kg)   SpO2 99%   BMI 25.46 kg/m   CV: regular , S1/S2 Resp: clear to auscultation bilaterally, normal RR and effort noted GI: soft, no tenderness, with active bowel sounds.   Assessment: Encounter Diagnosis  Name Primary?   Crohn's disease of colon without complication ([place]) Yes     Plan: Colonoscopy  The benefits and risks of the planned procedure were described in detail with the patient or (when appropriate) their health care proxy.  Risks were outlined as including, but not limited to, bleeding, infection, perforation, adverse medication reaction leading to cardiac or pulmonary decompensation, pancreatitis (if ERCP).  The  limitation of incomplete mucosal visualization was also discussed.  No guarantees or warranties were given.    The patient is appropriate for an endoscopic procedure in the ambulatory setting.   - Wilfrid Lund, MD

## 2021-12-29 NOTE — Op Note (Signed)
[facility] Patient Name: Daniel Allen Procedure Date: 12/29/2021 10:57 AM MRN: [id number] Endoscopist: Wilbur Park. Loletha Carrow , MD, [id number] Age: 61 Referring MD:  Date of Birth: April 28, 1960 Gender: Male Account #: [id number] Procedure:                Colonoscopy Indications:              Disease activity assessment of Crohn's disease of                            the colon                           Dx decade ago -longtime remission on mesalamine                           Last colonoscopy October 2021 -no polyps, no                            dysplasia on biopsies Medicines:                Monitored Anesthesia Care Procedure:                Pre-Anesthesia Assessment:                           - Prior to the procedure, a History and Physical                            was performed, and patient medications and                            allergies were reviewed. The patient's tolerance of                            previous anesthesia was also reviewed. The risks                            and benefits of the procedure and the sedation                            options and risks were discussed with the patient.                            All questions were answered, and informed consent                            was obtained. Prior Anticoagulants: The patient has                            taken no anticoagulant or antiplatelet agents. ASA                            Grade Assessment: II - A patient with mild systemic  disease. After reviewing the risks and benefits,                            the patient was deemed in satisfactory condition to                            undergo the procedure.                           After obtaining informed consent, the colonoscope                            was passed under direct vision. Throughout the                            procedure, the patient's blood pressure, pulse, and                            oxygen  saturations were monitored continuously. The                            Olympus Scope [id number] was introduced through the                            anus and advanced to the the terminal ileum, with                            identification of the appendiceal orifice and IC                            valve. The colonoscopy was performed without                            difficulty. The patient tolerated the procedure                            well. The quality of the bowel preparation was                            good. The terminal ileum, ileocecal valve,                            appendiceal orifice, and rectum were photographed.                            The bowel preparation used was SUPREP via split                            dose instruction. Scope In: 11:14:32 AM Scope Out: 11:32:35 AM Scope Withdrawal Time: 0 hours 13 minutes 36 seconds  Total Procedure Duration: 0 hours 18 minutes 3 seconds  Findings:                 The perianal and digital rectal examinations were  normal.                           The terminal ileum appeared normal.                           Normal mucosa was found in the entire colon.                            Biopsies were taken with a cold forceps from the                            ascending colon, transverse colon, descending colon                            and sigmoid colon for Crohn's disease surveillance.                            These biopsy specimens were sent to Pathology.                           There is no endoscopic evidence of polyps in the                            entire colon.                           Diverticula were found in the entire colon. (L>R)                           The exam was otherwise without abnormality on                            direct and retroflexion views. NBI used during a                            second pass of the right colon and during                            withdrawal  examination of the remainder of the                            colon. Complications:            No immediate complications. Estimated Blood Loss:     Estimated blood loss was minimal. Impression:               - The examined portion of the ileum was normal.                           - Normal mucosa in the entire examined colon.                            Biopsied.                           -  Diverticulosis in the entire examined colon.                           - The examination was otherwise normal on direct                            and retroflexion views. Recommendation:           - Patient has a contact number available for                            emergencies. The signs and symptoms of potential                            delayed complications were discussed with the                            patient. Return to normal activities tomorrow.                            Written discharge instructions were provided to the                            patient.                           - Resume previous diet.                           - Continue present medications.                           - Await pathology results.                           - Repeat colonoscopy is recommended for                            surveillance. The colonoscopy date will be                            determined after pathology results from today's                            exam become available for review. Kadince Boxley L. Loletha Carrow, MD 12/29/2021 11:42:24 AM This report has been signed electronically.

## 2021-12-29 NOTE — Patient Instructions (Signed)
Please read handouts provided. Continue present medications. Await pathology results.   YOU HAD AN ENDOSCOPIC PROCEDURE TODAY AT [facility]:   Refer to the procedure report that was given to you for any specific questions about what was found during the examination.  If the procedure report does not answer your questions, please call your gastroenterologist to clarify.  If you requested that your care partner not be given the details of your procedure findings, then the procedure report has been included in a sealed envelope for you to review at your convenience later.  YOU SHOULD EXPECT: Some feelings of bloating in the abdomen. Passage of more gas than usual.  Walking can help get rid of the air that was put into your GI tract during the procedure and reduce the bloating. If you had a lower endoscopy (such as a colonoscopy or flexible sigmoidoscopy) you may notice spotting of blood in your stool or on the toilet paper. If you underwent a bowel prep for your procedure, you may not have a normal bowel movement for a few days.  Please Note:  You might notice some irritation and congestion in your nose or some drainage.  This is from the oxygen used during your procedure.  There is no need for concern and it should clear up in a day or so.  SYMPTOMS TO REPORT IMMEDIATELY:  Following lower endoscopy (colonoscopy or flexible sigmoidoscopy):  Excessive amounts of blood in the stool  Significant tenderness or worsening of abdominal pains  Swelling of the abdomen that is new, acute  Fever of 100F or higher  For urgent or emergent issues, a gastroenterologist can be reached at any hour by calling [phone]. Do not use MyChart messaging for urgent concerns.    DIET:  We do recommend a small meal at first, but then you may proceed to your regular diet.  Drink plenty of fluids but you should avoid alcoholic beverages for 24 hours.  ACTIVITY:  You should plan to take it easy for  the rest of today and you should NOT DRIVE or use heavy machinery until tomorrow (because of the sedation medicines used during the test).    FOLLOW UP: Our staff will call the number listed on your records the next business day following your procedure.  We will call around 7:15- 8:00 am to check on you and address any questions or concerns that you may have regarding the information given to you following your procedure. If we do not reach you, we will leave a message.     If any biopsies were taken you will be contacted by phone or by letter within the next 1-3 weeks.  Please call us at [phone] if you have not heard about the biopsies in 3 weeks.    SIGNATURES/CONFIDENTIALITY: You and/or your care partner have signed paperwork which will be entered into your electronic medical record.  These signatures attest to the fact that that the information above on your After Visit Summary has been reviewed and is understood.  Full responsibility of the confidentiality of this discharge information lies with you and/or your care-partner.

## 2021-12-29 NOTE — Progress Notes (Signed)
Called to room to assist during endoscopic procedure.  Patient ID and intended procedure confirmed with present staff. Received instructions for my participation in the procedure from the performing physician.  

## 2021-12-29 NOTE — Progress Notes (Signed)
VS completed by CW.   Pt's states no medical or surgical changes since previsit or office visit.  

## 2021-12-30 ENCOUNTER — Telehealth: Payer: Self-pay | Admitting: *Deleted

## 2021-12-30 NOTE — Telephone Encounter (Signed)
  Follow up Call-     12/29/2021   10:41 AM 10/18/2019    7:55 AM  Call back number  Post procedure Call Back phone  # [phone]  Permission to leave phone message Yes Yes     Patient questions:  Do you have a fever, pain , or abdominal swelling? No. Pain Score  0 *  Have you tolerated food without any problems? Yes.    Have you been able to return to your normal activities? Yes.    Do you have any questions about your discharge instructions: Diet   No. Medications  No. Follow up visit  No.  Do you have questions or concerns about your Care? No.  Actions: * If pain score is 4 or above: No action needed, pain <4.

## 2022-01-05 ENCOUNTER — Other Ambulatory Visit: Payer: Self-pay | Admitting: Nurse Practitioner

## 2022-01-19 ENCOUNTER — Ambulatory Visit (INDEPENDENT_AMBULATORY_CARE_PROVIDER_SITE_OTHER): Payer: BC Managed Care – PPO | Admitting: Internal Medicine

## 2022-01-19 ENCOUNTER — Encounter: Payer: Self-pay | Admitting: Internal Medicine

## 2022-01-19 VITALS — BP 136/86 | HR 62 | Temp 98.3°F | Resp 16 | Ht 73.0 in | Wt 193.0 lb

## 2022-01-19 DIAGNOSIS — I10 Essential (primary) hypertension: Secondary | ICD-10-CM | POA: Diagnosis not present

## 2022-01-19 DIAGNOSIS — K5090 Crohn's disease, unspecified, without complications: Secondary | ICD-10-CM

## 2022-01-19 DIAGNOSIS — Z0000 Encounter for general adult medical examination without abnormal findings: Secondary | ICD-10-CM

## 2022-01-19 DIAGNOSIS — Z0001 Encounter for general adult medical examination with abnormal findings: Secondary | ICD-10-CM

## 2022-01-19 DIAGNOSIS — I7121 Aneurysm of the ascending aorta, without rupture: Secondary | ICD-10-CM

## 2022-01-19 DIAGNOSIS — Z125 Encounter for screening for malignant neoplasm of prostate: Secondary | ICD-10-CM | POA: Diagnosis not present

## 2022-01-19 DIAGNOSIS — E785 Hyperlipidemia, unspecified: Secondary | ICD-10-CM | POA: Diagnosis not present

## 2022-01-19 DIAGNOSIS — Z23 Encounter for immunization: Secondary | ICD-10-CM

## 2022-01-19 DIAGNOSIS — I8393 Asymptomatic varicose veins of bilateral lower extremities: Secondary | ICD-10-CM | POA: Insufficient documentation

## 2022-01-19 LAB — CBC WITH DIFFERENTIAL/PLATELET
Basophils Absolute: 0.1 10*3/uL (ref 0.0–0.1)
Basophils Relative: 0.8 % (ref 0.0–3.0)
Eosinophils Absolute: 0.2 10*3/uL (ref 0.0–0.7)
Eosinophils Relative: 2.5 % (ref 0.0–5.0)
HCT: 45.8 % (ref 39.0–52.0)
Hemoglobin: 15.3 g/dL (ref 13.0–17.0)
Lymphocytes Relative: 19.8 % (ref 12.0–46.0)
Lymphs Abs: 1.6 10*3/uL (ref 0.7–4.0)
MCHC: 33.4 g/dL (ref 30.0–36.0)
MCV: 90.3 fl (ref 78.0–100.0)
Monocytes Absolute: 0.3 10*3/uL (ref 0.1–1.0)
Monocytes Relative: 3.8 % (ref 3.0–12.0)
Neutro Abs: 5.9 10*3/uL (ref 1.4–7.7)
Neutrophils Relative %: 73.1 % (ref 43.0–77.0)
Platelets: 230 10*3/uL (ref 150.0–400.0)
RBC: 5.08 Mil/uL (ref 4.22–5.81)
RDW: 13.4 % (ref 11.5–15.5)
WBC: 8.1 10*3/uL (ref 4.0–10.5)

## 2022-01-19 LAB — BASIC METABOLIC PANEL
BUN: 14 mg/dL (ref 6–23)
CO2: 29 mEq/L (ref 19–32)
Calcium: 9.3 mg/dL (ref 8.4–10.5)
Chloride: 102 mEq/L (ref 96–112)
Creatinine, Ser: 0.94 mg/dL (ref 0.40–1.50)
GFR: 87.51 mL/min (ref >60.00)
Glucose, Bld: 94 mg/dL (ref 70–99)
Potassium: 4 mEq/L (ref 3.5–5.1)
Sodium: 141 mEq/L (ref 135–145)

## 2022-01-19 LAB — LIPID PANEL
Cholesterol: 154 mg/dL (ref 0–200)
HDL: 48.3 mg/dL (ref >39.00)
LDL Cholesterol: 87 mg/dL (ref 0–99)
NonHDL: 105.21
Total CHOL/HDL Ratio: 3
Triglycerides: 93 mg/dL (ref 0.0–149.0)
VLDL: 18.6 mg/dL (ref 0.0–40.0)

## 2022-01-19 LAB — PSA: PSA: 2.19 ng/mL (ref 0.10–4.00)

## 2022-01-19 LAB — HEPATIC FUNCTION PANEL
ALT: 20 U/L (ref 0–53)
AST: 20 U/L (ref 0–37)
Albumin: 4.9 g/dL (ref 3.5–5.2)
Alkaline Phosphatase: 71 U/L (ref 39–117)
Bilirubin, Direct: 0.1 mg/dL (ref 0.0–0.3)
Total Bilirubin: 0.6 mg/dL (ref 0.2–1.2)
Total Protein: 7.3 g/dL (ref 6.0–8.3)

## 2022-01-19 LAB — TSH: TSH: 1.65 u[IU]/mL (ref 0.35–5.50)

## 2022-01-19 MED ORDER — ATORVASTATIN CALCIUM 10 MG PO TABS: ORAL_TABLET | ORAL | 1 refills | Status: DC

## 2022-01-19 NOTE — Patient Instructions (Addendum)
PLEASE COME BACK IN 6 MONTHS FOR A BLOOD PRESSURE RECHECK AND THE SECOND SHINGLES VACCINE   Health Maintenance, Male Adopting a healthy lifestyle and getting preventive care are important in promoting health and wellness. Ask your health care provider about: The right schedule for you to have regular tests and exams. Things you can do on your own to prevent diseases and keep yourself healthy. What should I know about diet, weight, and exercise? Eat a healthy diet  Eat a diet that includes plenty of vegetables, fruits, low-fat dairy products, and lean protein. Do not eat a lot of foods that are high in solid fats, added sugars, or sodium. Maintain a healthy weight Body mass index (BMI) is a measurement that can be used to identify possible weight problems. It estimates body fat based on height and weight. Your health care provider can help determine your BMI and help you achieve or maintain a healthy weight. Get regular exercise Get regular exercise. This is one of the most important things you can do for your health. Most adults should: Exercise for at least 150 minutes each week. The exercise should increase your heart rate and make you sweat (moderate-intensity exercise). Do strengthening exercises at least twice a week. This is in addition to the moderate-intensity exercise. Spend less time sitting. Even light physical activity can be beneficial. Watch cholesterol and blood lipids Have your blood tested for lipids and cholesterol at 62 years of age, then have this test every 5 years. You may need to have your cholesterol levels checked more often if: Your lipid or cholesterol levels are high. You are older than 62 years of age. You are at high risk for heart disease. What should I know about cancer screening? Many types of cancers can be detected early and may often be prevented. Depending on your health history and family history, you may need to have cancer screening at various ages.  This may include screening for: Colorectal cancer. Prostate cancer. Skin cancer. Lung cancer. What should I know about heart disease, diabetes, and high blood pressure? Blood pressure and heart disease High blood pressure causes heart disease and increases the risk of stroke. This is more likely to develop in people who have high blood pressure readings or are overweight. Talk with your health care provider about your target blood pressure readings. Have your blood pressure checked: Every 3-5 years if you are 57-65 years of age. Every year if you are 79 years old or older. If you are between the ages of 32 and 67 and are a current or former smoker, ask your health care provider if you should have a one-time screening for abdominal aortic aneurysm (AAA). Diabetes Have regular diabetes screenings. This checks your fasting blood sugar level. Have the screening done: Once every three years after age 72 if you are at a normal weight and have a low risk for diabetes. More often and at a younger age if you are overweight or have a high risk for diabetes. What should I know about preventing infection? Hepatitis B If you have a higher risk for hepatitis B, you should be screened for this virus. Talk with your health care provider to find out if you are at risk for hepatitis B infection. Hepatitis C Blood testing is recommended for: Everyone born from [birth year] through 1965. Anyone with known risk factors for hepatitis C. Sexually transmitted infections (STIs) You should be screened each year for STIs, including gonorrhea and chlamydia, if: You are sexually active  and are younger than 62 years of age. You are older than 62 years of age and your health care provider tells you that you are at risk for this type of infection. Your sexual activity has changed since you were last screened, and you are at increased risk for chlamydia or gonorrhea. Ask your health care provider if you are at risk. Ask your  health care provider about whether you are at high risk for HIV. Your health care provider may recommend a prescription medicine to help prevent HIV infection. If you choose to take medicine to prevent HIV, you should first get tested for HIV. You should then be tested every 3 months for as long as you are taking the medicine. Follow these instructions at home: Alcohol use Do not drink alcohol if your health care provider tells you not to drink. If you drink alcohol: Limit how much you have to 0-2 drinks a day. Know how much alcohol is in your drink. In the [place] one drink equals one 12 oz bottle of beer (355 mL), one 5 oz glass of wine (148 mL), or one 1 oz glass of hard liquor (44 mL). Lifestyle Do not use any products that contain nicotine or tobacco. These products include cigarettes, chewing tobacco, and vaping devices, such as e-cigarettes. If you need help quitting, ask your health care provider. Do not use street drugs. Do not share needles. Ask your health care provider for help if you need support or information about quitting drugs. General instructions Schedule regular health, dental, and eye exams. Stay current with your vaccines. Tell your health care provider if: You often feel depressed. You have ever been abused or do not feel safe at home. Summary Adopting a healthy lifestyle and getting preventive care are important in promoting health and wellness. Follow your health care provider's instructions about healthy diet, exercising, and getting tested or screened for diseases. Follow your health care provider's instructions on monitoring your cholesterol and blood pressure. This information is not intended to replace advice given to you by your health care provider. Make sure you discuss any questions you have with your health care provider. Document Revised: 05/19/2020 Document Reviewed: 05/19/2020 Elsevier Patient Education  [place].

## 2022-01-19 NOTE — Progress Notes (Signed)
Subjective:  Patient ID: Daniel Allen, male    DOB: [birth year]  Age: 62 y.o. MRN: [id number]  CC: Annual Exam, Hypertension, and Hyperlipidemia   HPI Daniel Allen presents for a CPX and f/up -  He is active and denies DOE, CP, SOB, edema.  Outpatient Medications Prior to Visit  Medication Sig Dispense Refill   aspirin EC 81 MG tablet Take 1 tablet (81 mg total) by mouth daily. 90 tablet 1   famotidine (PEPCID) 40 MG tablet 1 tablet at bedtime Orally Once a day for 30 day(s)     Mesalamine 800 MG TBEC TAKE 2 TABLETS(1600 MG) BY MOUTH TWICE DAILY (Patient taking differently: Take 2 tabs by mouth once daily) 120 tablet 0   Multiple Vitamin (ONE-A-DAY MENS PO) Take 1 tablet by mouth daily at 6 (six) AM.     atorvastatin (LIPITOR) 10 MG tablet TAKE 1 TABLET BY MOUTH DAILY AT 6 PM 90 tablet 1   fluorouracil (EFUDEX) 5 % cream Apply 1 Application topically 2 (two) times daily.     No facility-administered medications prior to visit.    ROS Review of Systems  Constitutional:  Negative for chills, diaphoresis, fatigue and fever.  HENT: Negative.    Eyes: Negative.   Respiratory:  Negative for cough, chest tightness, shortness of breath and wheezing.   Cardiovascular:  Negative for chest pain, palpitations and leg swelling.  Gastrointestinal:  Negative for abdominal pain, constipation, diarrhea, nausea and vomiting.  Endocrine: Negative.   Genitourinary: Negative.  Negative for difficulty urinating.  Musculoskeletal:  Negative for arthralgias and myalgias.  Skin: Negative.  Negative for rash.  Neurological: Negative.  Negative for dizziness and weakness.  Hematological:  Negative for adenopathy. Does not bruise/bleed easily.  Psychiatric/Behavioral: Negative.      Objective:  BP 136/86 (BP Location: Right Arm, Patient Position: Sitting, Cuff Size: Large)   Pulse 62   Temp 98.3 F (36.8 C) (Oral)   Resp 16   Ht '6\' 1"'$  (1.854 m)   Wt 193 lb (87.5 kg)   SpO2 98%   BMI 25.46  kg/m   BP Readings from Last 3 Encounters:  01/19/22 136/86  12/29/21 123/72  12/25/20 134/86    Wt Readings from Last 3 Encounters:  01/19/22 193 lb (87.5 kg)  12/29/21 193 lb (87.5 kg)  11/30/21 193 lb (87.5 kg)    Physical Exam Vitals reviewed.  Constitutional:      Appearance: Normal appearance.  HENT:     Mouth/Throat:     Mouth: Mucous membranes are moist.  Eyes:     General: No scleral icterus.    Conjunctiva/sclera: Conjunctivae normal.  Cardiovascular:     Rate and Rhythm: Normal rate and regular rhythm.     Pulses: Normal pulses.     Heart sounds: No murmur heard. Pulmonary:     Effort: Pulmonary effort is normal.     Breath sounds: No stridor. No wheezing, rhonchi or rales.  Abdominal:     General: Abdomen is flat.     Palpations: There is no mass.     Tenderness: There is no abdominal tenderness. There is no guarding.     Hernia: No hernia is present.  Musculoskeletal:        General: Normal range of motion.     Cervical back: Neck supple.     Right lower leg: No edema.     Left lower leg: No edema.     Comments: Uncomplicated varicose veins BLE  Lymphadenopathy:  Cervical: No cervical adenopathy.  Skin:    General: Skin is warm and dry.     Coloration: Skin is not pale.     Findings: No rash.  Neurological:     General: No focal deficit present.     Mental Status: He is alert. Mental status is at baseline.  Psychiatric:        Mood and Affect: Mood normal.        Behavior: Behavior normal.     Lab Results  Component Value Date   WBC 8.1 01/19/2022   HGB 15.3 01/19/2022   HCT 45.8 01/19/2022   PLT 230.0 01/19/2022   GLUCOSE 94 01/19/2022   CHOL 154 01/19/2022   TRIG 93.0 01/19/2022   HDL 48.30 01/19/2022   LDLDIRECT 162.9 09/14/2013   LDLCALC 87 01/19/2022   ALT 20 01/19/2022   AST 20 01/19/2022   NA 141 01/19/2022   K 4.0 01/19/2022   CL 102 01/19/2022   CREATININE 0.94 01/19/2022   BUN 14 01/19/2022   CO2 29 01/19/2022    TSH 1.65 01/19/2022   PSA 2.19 01/19/2022   HGBA1C 5.3 08/01/2019    CT ANGIO CHEST AORTA W/ & OR WO/CM & GATING ([facility] ONLY)  Result Date: 01/01/2021 CLINICAL DATA:  Thoracic aortic aneurysm EXAM: CT ANGIOGRAPHY CHEST WITH CONTRAST TECHNIQUE: Multidetector CT imaging of the chest was performed using the standard protocol during bolus administration of intravenous contrast. Multiplanar CT image reconstructions and MIPs were obtained to evaluate the vascular anatomy. CONTRAST:  42m OMNIPAQUE IOHEXOL 350 MG/ML SOLN COMPARISON:  12/24/2019 FINDINGS: Cardiovascular: Initial noncontrast imaging demonstrates no hyperdense intramural hematoma. Minor fusiform aneurysmal dilatation of the ascending thoracic aorta measuring 42 mm, unchanged. Remainder of the thoracic aorta normal in caliber. No acute aortic process, dissection, or intramural hematoma. Patent 2 vessel arch anatomy. No mediastinal hemorrhage or hematoma. Central pulmonary arteries are patent. No large filling defect or pulmonary embolus. Normal heart size. No pericardial effusion. Central venous structures are patent. No veno-occlusive process. Native coronary atherosclerosis present. Mediastinum/Nodes: No enlarged mediastinal, hilar, or axillary lymph nodes. Thyroid gland, trachea, and esophagus demonstrate no significant findings. Lungs/Pleura: Minor dependent bibasilar atelectasis. No acute airspace process, collapse or consolidation. Negative for pneumonia. No interstitial process or edema. No pleural abnormality, effusion, or pneumothorax. Upper Abdomen: No acute abnormality. Musculoskeletal: No chest wall abnormality. No acute or significant osseous findings. Review of the MIP images confirms the above findings. IMPRESSION: Stable minor aneurysmal dilatation of the ascending thoracic aorta measuring 42 mm. Recommend annual imaging followup by CTA or MRA. This recommendation follows 2010 ACCF/AHA/AATS/ACR/ASA/SCA/SCAI/SIR/STS/SVM Guidelines  for the Diagnosis and Management of Patients with Thoracic Aortic Disease. Circulation. 2010; 121:: P591-M384 Aortic aneurysm NOS (ICD10-I71.9) Minor thoracic aortic atherosclerosis. Native coronary atherosclerosis. No other acute intrathoracic finding. Aortic Atherosclerosis (ICD10-I70.0). Electronically Signed   By: MJerilynn Mages  Shick M.D.   On: 01/01/2021 16:45    Assessment & Plan:   BTobywas seen today for annual exam, hypertension and hyperlipidemia.  Diagnoses and all orders for this visit:  Crohn's disease without complication, unspecified gastrointestinal tract location ([facility]- Sx's are well controlled.  Hyperlipidemia LDL goal <100- LDL goal achieved. Doing well on the statin  -     Lipid panel; Future -     TSH; Future -     Hepatic function panel; Future -     atorvastatin (LIPITOR) 10 MG tablet; TAKE 1 TABLET BY MOUTH DAILY AT 6 PM -     Hepatic function panel -  TSH -     Lipid panel  Encounter for general adult medical examination with abnormal findings- Exam completed, labs reviewed, vaccines reviewed, cancer screenings are UTD, pt ed was given. -     PSA; Future -     PSA  Primary hypertension- His BP is well controlled. -     TSH; Future -     CBC with Differential/Platelet; Future -     Basic metabolic panel; Future -     Basic metabolic panel -     CBC with Differential/Platelet -     TSH  Varicose veins of both lower extremities without ulcer or inflammation -     Ambulatory referral to Vascular Surgery  Aneurysm of ascending aorta without rupture (HCC) -     CT ANGIO CHEST AORTA W/ & OR WO/CM & GATING ([place] ONLY); Future  Other orders -     Tdap vaccine greater than or equal to 7yo IM -     Zoster Recombinant (Shingrix )   I have discontinued Alvester Chou C. Lombardo's fluorouracil. I am also having him maintain his Multiple Vitamin (ONE-A-DAY MENS PO), aspirin EC, famotidine, Mesalamine, and atorvastatin.  Meds ordered this encounter  Medications    atorvastatin (LIPITOR) 10 MG tablet    Sig: TAKE 1 TABLET BY MOUTH DAILY AT 6 PM    Dispense:  90 tablet    Refill:  1     Follow-up: Return in about 6 months (around 07/20/2022).  Scarlette Calico, MD

## 2022-01-30 DIAGNOSIS — M545 Low back pain, unspecified: Secondary | ICD-10-CM | POA: Diagnosis not present

## 2022-01-30 DIAGNOSIS — M62830 Muscle spasm of back: Secondary | ICD-10-CM | POA: Diagnosis not present

## 2022-03-10 DIAGNOSIS — R252 Cramp and spasm: Secondary | ICD-10-CM | POA: Diagnosis not present

## 2022-03-10 DIAGNOSIS — I10 Essential (primary) hypertension: Secondary | ICD-10-CM | POA: Diagnosis not present

## 2022-03-10 DIAGNOSIS — R600 Localized edema: Secondary | ICD-10-CM | POA: Diagnosis not present

## 2022-03-10 DIAGNOSIS — I83893 Varicose veins of bilateral lower extremities with other complications: Secondary | ICD-10-CM | POA: Diagnosis not present

## 2022-03-12 ENCOUNTER — Other Ambulatory Visit: Payer: Self-pay | Admitting: Nurse Practitioner

## 2022-03-16 DIAGNOSIS — K219 Gastro-esophageal reflux disease without esophagitis: Secondary | ICD-10-CM | POA: Diagnosis not present

## 2022-03-16 DIAGNOSIS — Z7189 Other specified counseling: Secondary | ICD-10-CM | POA: Diagnosis not present

## 2022-03-18 ENCOUNTER — Ambulatory Visit: Payer: BC Managed Care – PPO | Admitting: Internal Medicine

## 2022-03-18 ENCOUNTER — Ambulatory Visit (INDEPENDENT_AMBULATORY_CARE_PROVIDER_SITE_OTHER): Payer: BC Managed Care – PPO

## 2022-03-18 VITALS — BP 132/84 | HR 74 | Temp 98.0°F | Resp 16 | Ht 73.0 in | Wt 192.0 lb

## 2022-03-18 DIAGNOSIS — I7121 Aneurysm of the ascending aorta, without rupture: Secondary | ICD-10-CM | POA: Diagnosis not present

## 2022-03-18 DIAGNOSIS — R079 Chest pain, unspecified: Secondary | ICD-10-CM | POA: Diagnosis not present

## 2022-03-18 DIAGNOSIS — K2100 Gastro-esophageal reflux disease with esophagitis, without bleeding: Secondary | ICD-10-CM | POA: Diagnosis not present

## 2022-03-18 DIAGNOSIS — Z711 Person with feared health complaint in whom no diagnosis is made: Secondary | ICD-10-CM | POA: Diagnosis not present

## 2022-03-18 DIAGNOSIS — R072 Precordial pain: Secondary | ICD-10-CM

## 2022-03-18 LAB — CBC WITH DIFFERENTIAL/PLATELET
Basophils Absolute: 0 10*3/uL (ref 0.0–0.1)
Basophils Relative: 0.6 % (ref 0.0–3.0)
Eosinophils Absolute: 0.2 10*3/uL (ref 0.0–0.7)
Eosinophils Relative: 3.3 % (ref 0.0–5.0)
HCT: 45.4 % (ref 39.0–52.0)
Hemoglobin: 15.3 g/dL (ref 13.0–17.0)
Lymphocytes Relative: 21.9 % (ref 12.0–46.0)
Lymphs Abs: 1.6 10*3/uL (ref 0.7–4.0)
MCHC: 33.6 g/dL (ref 30.0–36.0)
MCV: 90.7 fl (ref 78.0–100.0)
Monocytes Absolute: 0.4 10*3/uL (ref 0.1–1.0)
Monocytes Relative: 5.4 % (ref 3.0–12.0)
Neutro Abs: 5.1 10*3/uL (ref 1.4–7.7)
Neutrophils Relative %: 68.8 % (ref 43.0–77.0)
Platelets: 222 10*3/uL (ref 150.0–400.0)
RBC: 5.01 Mil/uL (ref 4.22–5.81)
RDW: 13.8 % (ref 11.5–15.5)
WBC: 7.4 10*3/uL (ref 4.0–10.5)

## 2022-03-18 LAB — BRAIN NATRIURETIC PEPTIDE: Pro B Natriuretic peptide (BNP): 20 pg/mL (ref 0.0–100.0)

## 2022-03-18 LAB — D-DIMER, QUANTITATIVE: D-Dimer, Quant: <0.19 mcg/mL FEU (ref <0.50)

## 2022-03-18 LAB — TROPONIN I (HIGH SENSITIVITY): High Sens Troponin I: 6 ng/L (ref 2–17)

## 2022-03-18 MED ORDER — ESOMEPRAZOLE MAGNESIUM 40 MG PO CPDR: 40.0000 mg | DELAYED_RELEASE_CAPSULE | Freq: Every day | ORAL | 3 refills | Status: AC

## 2022-03-18 NOTE — Progress Notes (Signed)
Subjective:  Patient ID: Daniel Allen, male    DOB: 1960-03-05  Age: 62 y.o. MRN: [id number]  CC: Chest Pain and Gastroesophageal Reflux   HPI MERLE RAJENDRAN presents for f/up ---  He complains of a 3 week history of SSCP at rest. He describes it as the sensation of "trapped gas".  He has been taking an over-the-counter dose of Nexium.  He denies odynophagia, dysphagia, cough, shortness of breath, hemoptysis, or abdominal pain.  Outpatient Medications Prior to Visit  Medication Sig Dispense Refill   aspirin EC 81 MG tablet Take 1 tablet (81 mg total) by mouth daily. 90 tablet 1   atorvastatin (LIPITOR) 10 MG tablet TAKE 1 TABLET BY MOUTH DAILY AT 6 PM 90 tablet 1   famotidine (PEPCID) 40 MG tablet 1 tablet at bedtime Orally Once a day for 30 day(s)     Mesalamine 800 MG TBEC Take 2 tabs by mouth once daily 120 tablet 0   Multiple Vitamin (ONE-A-DAY MENS PO) Take 1 tablet by mouth daily at 6 (six) AM.     No facility-administered medications prior to visit.    ROS Review of Systems  Constitutional: Negative.  Negative for appetite change, diaphoresis, fatigue and unexpected weight change.  HENT: Negative.  Negative for sore throat and trouble swallowing.   Eyes: Negative.   Respiratory: Negative.  Negative for cough, chest tightness, shortness of breath and wheezing.   Cardiovascular:  Positive for chest pain. Negative for palpitations and leg swelling.  Gastrointestinal:  Negative for abdominal pain and diarrhea.  Endocrine: Negative.   Genitourinary: Negative.  Negative for difficulty urinating.  Musculoskeletal: Negative.   Skin: Negative.   Neurological: Negative.  Negative for weakness.  Hematological:  Negative for adenopathy. Does not bruise/bleed easily.  Psychiatric/Behavioral: Negative.      Objective:  BP 132/84 (BP Location: Right Arm, Patient Position: Sitting, Cuff Size: Large)   Pulse 74   Temp 98 F (36.7 C) (Oral)   Resp 16   Ht '6\' 1"'$  (1.854 m)   Wt  192 lb (87.1 kg)   SpO2 98%   BMI 25.33 kg/m   BP Readings from Last 3 Encounters:  03/19/22 132/84  01/19/22 136/86  12/29/21 123/72    Wt Readings from Last 3 Encounters:  03/18/22 192 lb (87.1 kg)  01/19/22 193 lb (87.5 kg)  12/29/21 193 lb (87.5 kg)    Physical Exam Vitals reviewed.  HENT:     Nose: Nose normal.     Mouth/Throat:     Mouth: Mucous membranes are moist.  Eyes:     General: No scleral icterus.    Conjunctiva/sclera: Conjunctivae normal.  Cardiovascular:     Rate and Rhythm: Regular rhythm. Bradycardia present.     Pulses:          Carotid pulses are 1+ on the right side and 1+ on the left side.      Radial pulses are 1+ on the right side and 1+ on the left side.       Femoral pulses are 1+ on the right side and 1+ on the left side.      Popliteal pulses are 1+ on the right side and 1+ on the left side.       Dorsalis pedis pulses are 1+ on the right side and 1+ on the left side.       Posterior tibial pulses are 1+ on the right side and 1+ on the left side.  Heart sounds: Normal heart sounds, S1 normal and S2 normal. No murmur heard.    No friction rub. No gallop.     Comments: EKG--- SB with 1 st degree AV block No Q waves or ST/T wave changes Pulmonary:     Effort: Pulmonary effort is normal.     Breath sounds: No stridor. No wheezing, rhonchi or rales.  Abdominal:     General: Abdomen is flat.     Palpations: There is no mass.     Tenderness: There is no guarding.     Hernia: No hernia is present.  Musculoskeletal:     Right lower leg: No edema.     Left lower leg: No edema.  Skin:    General: Skin is warm and dry.  Neurological:     General: No focal deficit present.     Mental Status: He is alert.  Psychiatric:        Mood and Affect: Mood normal.        Behavior: Behavior normal.     Lab Results  Component Value Date   WBC 7.4 03/18/2022   HGB 15.3 03/18/2022   HCT 45.4 03/18/2022   PLT 222.0 03/18/2022   GLUCOSE 94  01/19/2022   CHOL 154 01/19/2022   TRIG 93.0 01/19/2022   HDL 48.30 01/19/2022   LDLDIRECT 162.9 09/14/2013   LDLCALC 87 01/19/2022   ALT 20 01/19/2022   AST 20 01/19/2022   NA 141 01/19/2022   K 4.0 01/19/2022   CL 102 01/19/2022   CREATININE 0.94 01/19/2022   BUN 14 01/19/2022   CO2 29 01/19/2022   TSH 1.65 01/19/2022   PSA 2.19 01/19/2022   HGBA1C 5.3 08/01/2019    CT ANGIO CHEST AORTA W/ & OR WO/CM & GATING ([place] ONLY)  Result Date: 01/01/2021 CLINICAL DATA:  Thoracic aortic aneurysm EXAM: CT ANGIOGRAPHY CHEST WITH CONTRAST TECHNIQUE: Multidetector CT imaging of the chest was performed using the standard protocol during bolus administration of intravenous contrast. Multiplanar CT image reconstructions and MIPs were obtained to evaluate the vascular anatomy. CONTRAST:  36m OMNIPAQUE IOHEXOL 350 MG/ML SOLN COMPARISON:  12/24/2019 FINDINGS: Cardiovascular: Initial noncontrast imaging demonstrates no hyperdense intramural hematoma. Minor fusiform aneurysmal dilatation of the ascending thoracic aorta measuring 42 mm, unchanged. Remainder of the thoracic aorta normal in caliber. No acute aortic process, dissection, or intramural hematoma. Patent 2 vessel arch anatomy. No mediastinal hemorrhage or hematoma. Central pulmonary arteries are patent. No large filling defect or pulmonary embolus. Normal heart size. No pericardial effusion. Central venous structures are patent. No veno-occlusive process. Native coronary atherosclerosis present. Mediastinum/Nodes: No enlarged mediastinal, hilar, or axillary lymph nodes. Thyroid gland, trachea, and esophagus demonstrate no significant findings. Lungs/Pleura: Minor dependent bibasilar atelectasis. No acute airspace process, collapse or consolidation. Negative for pneumonia. No interstitial process or edema. No pleural abnormality, effusion, or pneumothorax. Upper Abdomen: No acute abnormality. Musculoskeletal: No chest wall abnormality. No acute or  significant osseous findings. Review of the MIP images confirms the above findings. IMPRESSION: Stable minor aneurysmal dilatation of the ascending thoracic aorta measuring 42 mm. Recommend annual imaging followup by CTA or MRA. This recommendation follows 2010 ACCF/AHA/AATS/ACR/ASA/SCA/SCAI/SIR/STS/SVM Guidelines for the Diagnosis and Management of Patients with Thoracic Aortic Disease. Circulation. 2010; [id number] Aortic aneurysm NOS (ICD10-I71.9) Minor thoracic aortic atherosclerosis. Native coronary atherosclerosis. No other acute intrathoracic finding. Aortic Atherosclerosis (ICD10-I70.0). Electronically Signed   By: MJerilynn Mages  Shick M.D.   On: 01/01/2021 16:45   DG Chest 2 View  Result Date:  03/18/2022 CLINICAL DATA:  Chest pain EXAM: CHEST - 2 VIEW COMPARISON:  12/25/2020 FINDINGS: Cardiac size is within normal limits. Increase in AP diameter of chest suggests COPD. Lung fields are clear of any infiltrates or pulmonary edema. There is no pleural effusion or pneumothorax. IMPRESSION: No active cardiopulmonary disease. Electronically Signed   By: Elmer Picker M.D.   On: 03/18/2022 16:55      Assessment & Plan:   Sorrell was seen today for chest pain and gastroesophageal reflux.  Diagnoses and all orders for this visit:  Substernal chest pain- Exam, chest x-ray, EKG, and labs are reassuring.  He has a history of thoracic aortic aneurysm. -     DG Chest 2 View; Future -     CBC with Differential/Platelet; Future -     Brain natriuretic peptide; Future -     Troponin I (High Sensitivity); Future -     D-dimer, quantitative; Future -     D-dimer, quantitative -     Troponin I (High Sensitivity) -     Brain natriuretic peptide -     CBC with Differential/Platelet -     CT Angio Chest W/Cm &/Or Wo Cm; Future  Aneurysm of ascending aorta without rupture ([place])- Will evaluate for thoracic aortic dissection. -     CT Angio Chest W/Cm &/Or Wo Cm; Future  Gastroesophageal reflux disease with  esophagitis without hemorrhage- This could be causing the chest pain.  Will increase the dose of the PPI. -     esomeprazole (NEXIUM) 40 MG capsule; Take 1 capsule (40 mg total) by mouth daily.   I am having Bryshere Mcferren. Slatten start on esomeprazole. I am also having him maintain his Multiple Vitamin (ONE-A-DAY MENS PO), aspirin EC, famotidine, atorvastatin, and Mesalamine.  Meds ordered this encounter  Medications   esomeprazole (NEXIUM) 40 MG capsule    Sig: Take 1 capsule (40 mg total) by mouth daily.    Dispense:  30 capsule    Refill:  3     Follow-up: No follow-ups on file.  Scarlette Calico, MD

## 2022-03-19 ENCOUNTER — Encounter: Payer: Self-pay | Admitting: Internal Medicine

## 2022-03-22 ENCOUNTER — Ambulatory Visit
Admission: RE | Admit: 2022-03-22 | Discharge: 2022-03-22 | Disposition: A | Discharge status: Home / Self Care | Payer: BC Managed Care – PPO | Source: Ambulatory Visit | Attending: Internal Medicine | Admitting: Internal Medicine

## 2022-03-22 ENCOUNTER — Other Ambulatory Visit: Payer: Self-pay | Admitting: Internal Medicine

## 2022-03-22 DIAGNOSIS — R072 Precordial pain: Secondary | ICD-10-CM

## 2022-03-22 DIAGNOSIS — I779 Disorder of arteries and arterioles, unspecified: Secondary | ICD-10-CM | POA: Diagnosis not present

## 2022-03-22 DIAGNOSIS — I7121 Aneurysm of the ascending aorta, without rupture: Secondary | ICD-10-CM

## 2022-03-22 DIAGNOSIS — I359 Nonrheumatic aortic valve disorder, unspecified: Secondary | ICD-10-CM | POA: Diagnosis not present

## 2022-03-22 DIAGNOSIS — K2100 Gastro-esophageal reflux disease with esophagitis, without bleeding: Secondary | ICD-10-CM

## 2022-03-22 DIAGNOSIS — I712 Thoracic aortic aneurysm, without rupture, unspecified: Secondary | ICD-10-CM | POA: Diagnosis not present

## 2022-03-22 MED ORDER — IOPAMIDOL (ISOVUE-370) INJECTION 76%
75.0000 mL | Freq: Once | INTRAVENOUS | Status: AC | PRN
  Administered 2022-03-22: 75 mL via INTRAVENOUS

## 2022-04-02 NOTE — Addendum Note (Signed)
Addended by: Hinda Kehr on: 04/02/2022 08:58 AM   Modules accepted: Orders

## 2022-04-22 DIAGNOSIS — Z719 Counseling, unspecified: Secondary | ICD-10-CM | POA: Diagnosis not present

## 2022-04-22 DIAGNOSIS — E663 Overweight: Secondary | ICD-10-CM | POA: Diagnosis not present

## 2022-05-16 ENCOUNTER — Other Ambulatory Visit: Payer: Self-pay | Admitting: Nurse Practitioner

## 2022-06-15 DIAGNOSIS — K219 Gastro-esophageal reflux disease without esophagitis: Secondary | ICD-10-CM | POA: Diagnosis not present

## 2022-06-15 DIAGNOSIS — E785 Hyperlipidemia, unspecified: Secondary | ICD-10-CM | POA: Diagnosis not present

## 2022-06-15 DIAGNOSIS — Z760 Encounter for issue of repeat prescription: Secondary | ICD-10-CM | POA: Diagnosis not present

## 2022-07-16 ENCOUNTER — Other Ambulatory Visit: Payer: Self-pay | Admitting: Nurse Practitioner

## 2022-08-06 DIAGNOSIS — M2170 Unequal limb length (acquired), unspecified site: Secondary | ICD-10-CM | POA: Diagnosis not present

## 2022-08-06 DIAGNOSIS — E789 Disorder of lipoprotein metabolism, unspecified: Secondary | ICD-10-CM | POA: Diagnosis not present

## 2022-08-06 DIAGNOSIS — I7121 Aneurysm of the ascending aorta, without rupture: Secondary | ICD-10-CM | POA: Diagnosis not present

## 2022-08-06 DIAGNOSIS — Z719 Counseling, unspecified: Secondary | ICD-10-CM | POA: Diagnosis not present

## 2022-08-06 DIAGNOSIS — R931 Abnormal findings on diagnostic imaging of heart and coronary circulation: Secondary | ICD-10-CM | POA: Diagnosis not present

## 2022-09-20 ENCOUNTER — Other Ambulatory Visit: Payer: Self-pay

## 2022-09-20 MED ORDER — MESALAMINE 800 MG PO TBEC: DELAYED_RELEASE_TABLET | ORAL | 0 refills | Status: DC

## 2022-09-21 ENCOUNTER — Telehealth: Payer: Self-pay | Admitting: Gastroenterology

## 2022-09-21 NOTE — Telephone Encounter (Signed)
Inbound call from patient in using  if he is able to do a virtual visit to continue receiving medication refills. Please advise.   Thank you

## 2022-09-21 NOTE — Telephone Encounter (Signed)
Thank you for the message, and I am glad he called.  As it happens, I just opened up a telemedicine-only clinic block on the morning of Tuesday, October 1.  (Slots can only be booked by my clinical staff) As the schedule stands now, that is the only telemedicine block I have for the next few months.  Please offer him a slot there.   - HD

## 2022-09-21 NOTE — Telephone Encounter (Signed)
Called and spoke with patient to schedule telemedicine appt as offered below by Dr. Myrtie Neither. Pt is scheduled on 10/12/22 at 8:20 am. Pt knows that a link will be sent to him via MyChart so that he can login for his appt. Pt is aware that refill has been sent to his pharmacy. Pt verbalized understanding and had no concerns at the end of the call.

## 2022-09-29 DIAGNOSIS — H5203 Hypermetropia, bilateral: Secondary | ICD-10-CM | POA: Diagnosis not present

## 2022-09-29 DIAGNOSIS — Z9889 Other specified postprocedural states: Secondary | ICD-10-CM | POA: Diagnosis not present

## 2022-09-29 DIAGNOSIS — H524 Presbyopia: Secondary | ICD-10-CM | POA: Diagnosis not present

## 2022-09-29 DIAGNOSIS — H52203 Unspecified astigmatism, bilateral: Secondary | ICD-10-CM | POA: Diagnosis not present

## 2022-10-12 ENCOUNTER — Telehealth (INDEPENDENT_AMBULATORY_CARE_PROVIDER_SITE_OTHER): Payer: BC Managed Care – PPO | Admitting: Gastroenterology

## 2022-10-12 DIAGNOSIS — K5010 Crohn's disease of large intestine without complications: Secondary | ICD-10-CM | POA: Diagnosis not present

## 2022-10-12 NOTE — Progress Notes (Signed)
Telemedicine Visit:  Participants on the conference : myself and patient   The patient consented to this consultation and was aware that a charge will be placed through their insurance.  They were also made aware of the limitations of telemedicine.  I was in my office and the patient was at home.   Encounter time:  Total time 20 minutes, with 15 minutes spent with patient on Caregility   _____________________________________________________________________________________________            [facility] Progress Note  Chief Complaint: Crohn's colitis  Subjective  History: Crohn's colitis diagnosed late 14s in [place], later followed by Dr. Arlyce Dice at this practice.  Care started by Dr. Myrtie Neither in 2020, at which time patient had been off mesalamine, and colonoscopy showed active and mild patchy left-sided colitis.  Afterward, [place] was started back on mesalamine that he has continued since then.  Colonoscopies in October 2021 and December 2023 have showed no active colitis, no polyps, no dysplasia on biopsies.  Yassir is glad to report that he is feeling well these days.  He denies abdominal pain, diarrhea or rectal bleeding.  Appetite has been good, and he is tolerating mesalamine well at a dose of 1600 mg every morning.  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  ROS: Cardiovascular:  no chest pain Respiratory: no dyspnea  The patient's Past Medical, Family and Social History were reviewed and are on file in the EMR.  Objective:  Med list reviewed  Current Outpatient Medications:    aspirin EC 81 MG tablet, Take 1 tablet (81 mg total) by mouth daily., Disp: 90 tablet, Rfl: 1   atorvastatin (LIPITOR) 10 MG tablet, TAKE 1 TABLET BY  MOUTH DAILY AT 6 PM, Disp: 90 tablet, Rfl: 1   esomeprazole (NEXIUM) 40 MG capsule, Take 1 capsule (40 mg total) by mouth daily., Disp: 30 capsule, Rfl: 3   famotidine (PEPCID) 40 MG tablet, 1 tablet at bedtime Orally Once a day for 30 day(s), Disp: , Rfl:    Mesalamine 800 MG TBEC, TAKE 2 TABLETS BY MOUTH DAILY, Disp: 120 tablet, Rfl: 0   Multiple Vitamin (ONE-A-DAY MENS PO), Take 1 tablet by mouth daily at 6 (six) AM., Disp: , Rfl:    Vital signs in last 24 hrs: There were no vitals filed for this visit. Wt Readings from Last 3 Encounters:  03/18/22 192 lb (87.1 kg)  01/19/22 193 lb (87.5 kg)  12/29/21 193 lb (87.5 kg)    Physical Exam  Looks well-no exam Telemedicine visit Labs:     Latest Ref Rng & Units 03/18/2022    4:41 PM 01/19/2022    2:51 PM 12/25/2020   11:16 AM  CBC  WBC 4.0 - 10.5 K/uL 7.4  8.1  9.5   Hemoglobin 13.0 - 17.0 g/dL 91.4  78.2  95.6   Hematocrit 39.0 - 52.0 % 45.4  45.8  46.1   Platelets 150.0 - 400.0 K/uL 222.0  230.0  228.0       Latest Ref Rng & Units 01/19/2022    2:51 PM 12/25/2020   11:16 AM 10/18/2019    9:33 AM  CMP  Glucose 70 - 99 mg/dL 94  84    BUN 6 -  23 mg/dL 14  16  10    Creatinine 0.40 - 1.50 mg/dL 1.61  0.96  0.45   Sodium 135 - 145 mEq/L 141  140    Potassium 3.5 - 5.1 mEq/L 4.0  4.3    Chloride 96 - 112 mEq/L 102  103    CO2 19 - 32 mEq/L 29  29    Calcium 8.4 - 10.5 mg/dL 9.3  9.9    Total Protein 6.0 - 8.3 g/dL 7.3  7.6    Total Bilirubin 0.2 - 1.2 mg/dL 0.6  0.7    Alkaline Phos 39 - 117 U/L 71  66    AST 0 - 37 U/L 20  20    ALT 0 - 53 U/L 20  23      ___________________________________________ Radiologic studies:   ____________________________________________ Other: 1. Surgical [P], colon, ascending - COLONIC MUCOSA WITH NO SPECIFIC HISTOPATHOLOGIC CHANGES - NEGATIVE FOR ACUTE INFLAMMATION, FEATURES OF CHRONICITY, GRANULOMAS OR DYSPLASIA 2. Surgical [P], colon, transverse - COLONIC MUCOSA WITH NO SPECIFIC  HISTOPATHOLOGIC CHANGES - NEGATIVE FOR ACUTE INFLAMMATION, FEATURES OF CHRONICITY, GRANULOMAS OR DYSPLASIA 3. Surgical [P], colon, descending - FOCAL ACTIVE COLITIS WITH A NONNECROTIZING EPITHELIOID CELL GRANULOMA, CONSISTENT WITH PATIENT'S CLINICAL HISTORY OF CROHN'S DISEASE - NEGATIVE FOR DYSPLASIA 4. Surgical [P], colon, sigmoid - COLONIC MUCOSA WITHIN NORMAL LIMITS; HOWEVER, WELL-HEALED CHRONIC COLITIS CANNOT BE RULED OUT - NEGATIVE FOR GRANULOMAS OR DYSPLASIA  _____________________________________________ Assessment & Plan  Assessment: Encounter Diagnosis  Name Primary?   Crohn's disease of colon without complication (HCC) Yes   Quiescent mild Crohn's colitis diagnosed perhaps 25 years ago or more, currently doing well on low-dose mesalamine.  Tolerating medicine well, reassuring last 2 colonoscopies.  Most recent CBC and CMP in our health system normal.  He and I discussed the rare risk of mesalamine induced nephritis and the need for annual BUN/creatinine check Plan: Continue current medicine dose, and medicines will be refilled in the interim until I see him again. He is due for surveillance colonoscopy approximately December 2023 and is on the recall list.  He will contact us around that time if he has not heard from [place] by then.   Charlie Pitter III

## 2022-11-16 DIAGNOSIS — L570 Actinic keratosis: Secondary | ICD-10-CM | POA: Diagnosis not present

## 2022-11-16 DIAGNOSIS — D225 Melanocytic nevi of trunk: Secondary | ICD-10-CM | POA: Diagnosis not present

## 2022-11-16 DIAGNOSIS — Z85828 Personal history of other malignant neoplasm of skin: Secondary | ICD-10-CM | POA: Diagnosis not present

## 2022-11-16 DIAGNOSIS — D2261 Melanocytic nevi of right upper limb, including shoulder: Secondary | ICD-10-CM | POA: Diagnosis not present

## 2022-11-16 DIAGNOSIS — D2262 Melanocytic nevi of left upper limb, including shoulder: Secondary | ICD-10-CM | POA: Diagnosis not present

## 2022-11-18 ENCOUNTER — Other Ambulatory Visit: Payer: Self-pay

## 2022-11-18 MED ORDER — MESALAMINE 800 MG PO TBEC: DELAYED_RELEASE_TABLET | ORAL | 0 refills | Status: DC

## 2023-01-26 ENCOUNTER — Encounter: Payer: Self-pay | Admitting: Internal Medicine

## 2023-01-26 ENCOUNTER — Ambulatory Visit (INDEPENDENT_AMBULATORY_CARE_PROVIDER_SITE_OTHER): Payer: BC Managed Care – PPO | Admitting: Internal Medicine

## 2023-01-26 VITALS — BP 132/84 | HR 66 | Temp 97.8°F | Resp 16 | Ht 73.0 in | Wt 189.8 lb

## 2023-01-26 DIAGNOSIS — K5090 Crohn's disease, unspecified, without complications: Secondary | ICD-10-CM

## 2023-01-26 DIAGNOSIS — R300 Dysuria: Secondary | ICD-10-CM | POA: Diagnosis not present

## 2023-01-26 DIAGNOSIS — Z0000 Encounter for general adult medical examination without abnormal findings: Secondary | ICD-10-CM

## 2023-01-26 DIAGNOSIS — N410 Acute prostatitis: Secondary | ICD-10-CM

## 2023-01-26 DIAGNOSIS — K219 Gastro-esophageal reflux disease without esophagitis: Secondary | ICD-10-CM | POA: Diagnosis not present

## 2023-01-26 DIAGNOSIS — E785 Hyperlipidemia, unspecified: Secondary | ICD-10-CM

## 2023-01-26 DIAGNOSIS — Z0001 Encounter for general adult medical examination with abnormal findings: Secondary | ICD-10-CM

## 2023-01-26 LAB — LIPID PANEL
Cholesterol: 166 mg/dL (ref 0–200)
HDL: 56.3 mg/dL
LDL Cholesterol: 91 mg/dL (ref 0–99)
NonHDL: 109.56
Total CHOL/HDL Ratio: 3
Triglycerides: 94 mg/dL (ref 0.0–149.0)
VLDL: 18.8 mg/dL (ref 0.0–40.0)

## 2023-01-26 LAB — URINALYSIS, ROUTINE W REFLEX MICROSCOPIC
Bilirubin Urine: NEGATIVE
Hgb urine dipstick: NEGATIVE
Ketones, ur: NEGATIVE
Leukocytes,Ua: NEGATIVE
Nitrite: NEGATIVE
RBC / HPF: NONE SEEN (ref 0–?)
Specific Gravity, Urine: 1.015 (ref 1.000–1.030)
Total Protein, Urine: NEGATIVE
Urine Glucose: NEGATIVE
Urobilinogen, UA: 0.2 (ref 0.0–1.0)
WBC, UA: NONE SEEN (ref 0–?)
pH: 7 (ref 5.0–8.0)

## 2023-01-26 LAB — CBC WITH DIFFERENTIAL/PLATELET
Basophils Absolute: 0.1 10*3/uL (ref 0.0–0.1)
Basophils Relative: 0.8 % (ref 0.0–3.0)
Eosinophils Absolute: 0.3 10*3/uL (ref 0.0–0.7)
Eosinophils Relative: 4.2 % (ref 0.0–5.0)
HCT: 48.6 % (ref 39.0–52.0)
Hemoglobin: 16.2 g/dL (ref 13.0–17.0)
Lymphocytes Relative: 22.1 % (ref 12.0–46.0)
Lymphs Abs: 1.6 10*3/uL (ref 0.7–4.0)
MCHC: 33.4 g/dL (ref 30.0–36.0)
MCV: 93.4 fL (ref 78.0–100.0)
Monocytes Absolute: 0.4 10*3/uL (ref 0.1–1.0)
Monocytes Relative: 5.1 % (ref 3.0–12.0)
Neutro Abs: 4.8 10*3/uL (ref 1.4–7.7)
Neutrophils Relative %: 67.8 % (ref 43.0–77.0)
Platelets: 241 10*3/uL (ref 150.0–400.0)
RBC: 5.2 Mil/uL (ref 4.22–5.81)
RDW: 13.5 % (ref 11.5–15.5)
WBC: 7.1 10*3/uL (ref 4.0–10.5)

## 2023-01-26 LAB — BASIC METABOLIC PANEL WITH GFR
BUN: 15 mg/dL (ref 6–23)
CO2: 29 meq/L (ref 19–32)
Calcium: 9.8 mg/dL (ref 8.4–10.5)
Chloride: 104 meq/L (ref 96–112)
Creatinine, Ser: 0.88 mg/dL (ref 0.40–1.50)
GFR: 92.17 mL/min
Glucose, Bld: 99 mg/dL (ref 70–99)
Potassium: 4.7 meq/L (ref 3.5–5.1)
Sodium: 141 meq/L (ref 135–145)

## 2023-01-26 LAB — HEPATIC FUNCTION PANEL
ALT: 28 U/L (ref 0–53)
AST: 27 U/L (ref 0–37)
Albumin: 5.2 g/dL (ref 3.5–5.2)
Alkaline Phosphatase: 73 U/L (ref 39–117)
Bilirubin, Direct: 0.1 mg/dL (ref 0.0–0.3)
Total Bilirubin: 0.7 mg/dL (ref 0.2–1.2)
Total Protein: 7.7 g/dL (ref 6.0–8.3)

## 2023-01-26 LAB — PSA: PSA: 3.02 ng/mL (ref 0.10–4.00)

## 2023-01-26 NOTE — Patient Instructions (Signed)
 Health Maintenance, Male  Adopting a healthy lifestyle and getting preventive care are important in promoting health and wellness. Ask your health care provider about:  The right schedule for you to have regular tests and exams.  Things you can do on your own to prevent diseases and keep yourself healthy.  What should I know about diet, weight, and exercise?  Eat a healthy diet    Eat a diet that includes plenty of vegetables, fruits, low-fat dairy products, and lean protein.  Do not eat a lot of foods that are high in solid fats, added sugars, or sodium.  Maintain a healthy weight  Body mass index (BMI) is a measurement that can be used to identify possible weight problems. It estimates body fat based on height and weight. Your health care provider can help determine your BMI and help you achieve or maintain a healthy weight.  Get regular exercise  Get regular exercise. This is one of the most important things you can do for your health. Most adults should:  Exercise for at least 150 minutes each week. The exercise should increase your heart rate and make you sweat (moderate-intensity exercise).  Do strengthening exercises at least twice a week. This is in addition to the moderate-intensity exercise.  Spend less time sitting. Even light physical activity can be beneficial.  Watch cholesterol and blood lipids  Have your blood tested for lipids and cholesterol at 63 years of age, then have this test every 5 years.  You may need to have your cholesterol levels checked more often if:  Your lipid or cholesterol levels are high.  You are older than 63 years of age.  You are at high risk for heart disease.  What should I know about cancer screening?  Many types of cancers can be detected early and may often be prevented. Depending on your health history and family history, you may need to have cancer screening at various ages. This may include screening for:  Colorectal cancer.  Prostate cancer.  Skin cancer.  Lung  cancer.  What should I know about heart disease, diabetes, and high blood pressure?  Blood pressure and heart disease  High blood pressure causes heart disease and increases the risk of stroke. This is more likely to develop in people who have high blood pressure readings or are overweight.  Talk with your health care provider about your target blood pressure readings.  Have your blood pressure checked:  Every 3-5 years if you are 9-95 years of age.  Every year if you are 85 years old or older.  If you are between the ages of 29 and 29 and are a current or former smoker, ask your health care provider if you should have a one-time screening for abdominal aortic aneurysm (AAA).  Diabetes  Have regular diabetes screenings. This checks your fasting blood sugar level. Have the screening done:  Once every three years after age 23 if you are at a normal weight and have a low risk for diabetes.  More often and at a younger age if you are overweight or have a high risk for diabetes.  What should I know about preventing infection?  Hepatitis B  If you have a higher risk for hepatitis B, you should be screened for this virus. Talk with your health care provider to find out if you are at risk for hepatitis B infection.  Hepatitis C  Blood testing is recommended for:  Everyone born from [birth year] through 1965.  Anyone  with known risk factors for hepatitis C.  Sexually transmitted infections (STIs)  You should be screened each year for STIs, including gonorrhea and chlamydia, if:  You are sexually active and are younger than 62 years of age.  You are older than 63 years of age and your health care provider tells you that you are at risk for this type of infection.  Your sexual activity has changed since you were last screened, and you are at increased risk for chlamydia or gonorrhea. Ask your health care provider if you are at risk.  Ask your health care provider about whether you are at high risk for HIV. Your health care provider  may recommend a prescription medicine to help prevent HIV infection. If you choose to take medicine to prevent HIV, you should first get tested for HIV. You should then be tested every 3 months for as long as you are taking the medicine.  Follow these instructions at home:  Alcohol use  Do not drink alcohol if your health care provider tells you not to drink.  If you drink alcohol:  Limit how much you have to 0-2 drinks a day.  Know how much alcohol is in your drink. In the [place] one drink equals one 12 oz bottle of beer (355 mL), one 5 oz glass of wine (148 mL), or one 1 oz glass of hard liquor (44 mL).  Lifestyle  Do not use any products that contain nicotine or tobacco. These products include cigarettes, chewing tobacco, and vaping devices, such as e-cigarettes. If you need help quitting, ask your health care provider.  Do not use street drugs.  Do not share needles.  Ask your health care provider for help if you need support or information about quitting drugs.  General instructions  Schedule regular health, dental, and eye exams.  Stay current with your vaccines.  Tell your health care provider if:  You often feel depressed.  You have ever been abused or do not feel safe at home.  Summary  Adopting a healthy lifestyle and getting preventive care are important in promoting health and wellness.  Follow your health care provider's instructions about healthy diet, exercising, and getting tested or screened for diseases.  Follow your health care provider's instructions on monitoring your cholesterol and blood pressure.  This information is not intended to replace advice given to you by your health care provider. Make sure you discuss any questions you have with your health care provider.  Document Revised: 05/19/2020 Document Reviewed: 05/19/2020  Elsevier Patient Education  2024 [employer].

## 2023-01-26 NOTE — Progress Notes (Signed)
Subjective:  Patient ID: Daniel Allen, male    DOB: 1960/09/06  Age: 63 y.o. MRN: [id number]  CC: Annual Exam, Hypertension, and Hyperlipidemia   HPI ZAKARIAH WALTMAN presents for a CPX and f/up ---  Discussed the use of AI scribe software for clinical note transcription with the patient, who gave verbal consent to proceed.  History of Present Illness   The patient, with a history of aortic aneurysm and IBD, presents with recent onset of dysuria. Over the past two weeks, he has experienced pain at the onset of urination and intermittent discomfort in the testicular and penile region. He reports nocturia, typically waking once per night, but denies any changes in urine stream, straining, or hematuria.  In addition to the urological symptoms, the patient notes occasional, minor rectal bleeding, which he is unsure if it is related to bowel movements or wiping. He denies any associated pain or cramping.  The patient's other chronic conditions appear to be stable. He denies any symptoms related to his known aortic aneurysm, and his bowel disease seems to be under control. He also denies any recent heartburn symptoms or difficulty swallowing.  The patient's medications include Nexium, baby aspirin, mesalamine, and a cholesterol-lowering agent. 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  Overall, the patient maintains a good appetite and weight, and denies any recent changes in sleep patterns, chest pain, or shortness of breath.       Outpatient Medications Prior to Visit  Medication Sig Dispense Refill   aspirin EC 81 MG tablet Take 1 tablet (81 mg total) by mouth daily. 90 tablet 1   esomeprazole (NEXIUM) 40 MG capsule Take 1 capsule (40 mg total) by mouth daily. 30 capsule 3   MAGNESIUM PO Take by mouth. 2 tablets  daily     Mesalamine 800 MG TBEC TAKE 2 TABLETS BY MOUTH DAILY 120 tablet 0   Multiple Vitamin (ONE-A-DAY MENS PO) Take 1 tablet by mouth daily at 6 (six) AM.     Probiotic Product (PROBIOTIC PO) Take by mouth.     atorvastatin (LIPITOR) 10 MG tablet TAKE 1 TABLET BY MOUTH DAILY AT 6 PM 90 tablet 1   famotidine (PEPCID) 40 MG tablet 1 tablet at bedtime Orally Once a day for 30 day(s)     No facility-administered medications prior to visit.    ROS Review of Systems  Constitutional: Negative.  Negative for chills, fatigue, fever and unexpected weight change.  HENT: Negative.    Eyes: Negative.   Respiratory: Negative.  Negative for cough, chest tightness, wheezing and stridor.   Cardiovascular:  Negative for chest pain, palpitations and leg swelling.  Gastrointestinal: Negative.  Negative for abdominal pain, constipation, diarrhea, nausea and vomiting.  Endocrine: Negative.   Genitourinary:  Positive for dysuria. Negative for difficulty urinating, hematuria, scrotal swelling, testicular pain and urgency.  Musculoskeletal: Negative.  Negative for arthralgias and myalgias.  Skin: Negative.   Neurological: Negative.  Negative for dizziness, weakness and headaches.  Hematological:  Negative for adenopathy. Does not bruise/bleed easily.  Psychiatric/Behavioral: Negative.      Objective:  BP 132/84 (BP Location: Left Arm, Patient Position: Sitting, Cuff Size: Normal)   Pulse 66   Temp 97.8 F (36.6 C) (Oral)   Resp 16   Ht 6\' 1"  (1.854 m)   Wt 189 lb 12.8 oz (  86.1 kg)   SpO2 98%   BMI 25.04 kg/m   BP Readings from Last 3 Encounters:  01/26/23 132/84  03/19/22 132/84  01/19/22 136/86    Wt Readings from Last 3 Encounters:  01/26/23 189 lb 12.8 oz (86.1 kg)  03/18/22 192 lb (87.1 kg)  01/19/22 193 lb (87.5 kg)    Physical Exam Vitals reviewed.  Constitutional:      Appearance: Normal appearance.  HENT:     Nose: Nose normal.     Mouth/Throat:     Mouth: Mucous  membranes are moist.  Eyes:     General: No scleral icterus.    Conjunctiva/sclera: Conjunctivae normal.  Cardiovascular:     Rate and Rhythm: Normal rate and regular rhythm.     Heart sounds: No murmur heard. Pulmonary:     Effort: Pulmonary effort is normal.     Breath sounds: No stridor. No wheezing, rhonchi or rales.  Abdominal:     General: Abdomen is flat.     Palpations: There is no mass.     Tenderness: There is no abdominal tenderness. There is no guarding.     Hernia: No hernia is present. There is no hernia in the left inguinal area or right inguinal area.  Genitourinary:    Pubic Area: No rash.      Penis: Normal and circumcised.      Testes: Normal.        Right: Mass, tenderness or swelling not present. Right testis is descended.        Left: Mass, tenderness or swelling not present. Left testis is descended.     Epididymis:     Right: Normal. Not inflamed or enlarged. No mass or tenderness.     Left: Normal. Not inflamed or enlarged. No mass or tenderness.     Prostate: Enlarged and tender. No nodules present.     Rectum: Normal. Guaiac result negative. No mass, tenderness, anal fissure, external hemorrhoid or internal hemorrhoid. Normal anal tone.  Musculoskeletal:        General: Normal range of motion.     Cervical back: Neck supple.     Right lower leg: No edema.     Left lower leg: No edema.  Lymphadenopathy:     Cervical: No cervical adenopathy.     Lower Body: No right inguinal adenopathy. No left inguinal adenopathy.  Skin:    General: Skin is warm and dry.  Neurological:     General: No focal deficit present.     Mental Status: He is alert. Mental status is at baseline.  Psychiatric:        Mood and Affect: Mood normal.        Behavior: Behavior normal.        Thought Content: Thought content normal.        Judgment: Judgment normal.     Lab Results  Component Value Date   WBC 7.1 01/26/2023   HGB 16.2 01/26/2023   HCT 48.6 01/26/2023   PLT  241.0 01/26/2023   GLUCOSE 99 01/26/2023   CHOL 166 01/26/2023   TRIG 94.0 01/26/2023   HDL 56.30 01/26/2023   LDLDIRECT 162.9 09/14/2013   LDLCALC 91 01/26/2023   ALT 28 01/26/2023   AST 27 01/26/2023   NA 141 01/26/2023   K 4.7 01/26/2023   CL 104 01/26/2023   CREATININE 0.88 01/26/2023   BUN 15 01/26/2023   CO2 29 01/26/2023   TSH 1.65 01/19/2022   PSA 3.02 01/26/2023  HGBA1C 5.3 08/01/2019    CT ANGIO CHEST AORTA W/CM & OR WO/CM Result Date: 03/22/2022 CLINICAL DATA:  Dyspnea.  3 week history of chest pain. EXAM: CT ANGIOGRAPHY CHEST WITH CONTRAST TECHNIQUE: Multidetector CT imaging of the chest was performed using the standard protocol during bolus administration of intravenous contrast. Multiplanar CT image reconstructions and MIPs were obtained to evaluate the vascular anatomy. RADIATION DOSE REDUCTION: This exam was performed according to the departmental dose-optimization program which includes automated exposure control, adjustment of the mA and/or kV according to patient size and/or use of iterative reconstruction technique. CONTRAST:  75mL ISOVUE-370 IOPAMIDOL (ISOVUE-370) INJECTION 76% COMPARISON:  01/01/2021 FINDINGS: Cardiovascular: Pre contrast imaging shows no hyperdense crescent in the wall of the thoracic aorta to suggest the presence of an acute intramural hematoma. Ascending thoracic aorta measures 4.0 cm diameter. No evidence for dissection flap in the thoracic aorta. Thoracic aortic arch vessel anatomy is widely patent. No large central pulmonary embolus in the main or lobar pulmonary arteries of either lung. Mediastinum/Nodes: No mediastinal lymphadenopathy. There is no hilar lymphadenopathy. The esophagus has normal imaging features. There is no axillary lymphadenopathy. Lungs/Pleura: No suspicious pulmonary nodule or mass. No focal airspace consolidation. There is no evidence of pleural effusion. Upper Abdomen: Visualized portion of the upper abdomen is unremarkable.  Musculoskeletal: No worrisome lytic or sclerotic osseous abnormality. Review of the MIP images confirms the above findings. IMPRESSION: 1. No evidence for acute intramural hematoma or dissection of the thoracic aorta. 2. Ascending thoracic aorta measures 4.0 cm diameter. Recommend annual imaging followup by CTA or MRA. This recommendation follows 2010 ACCF/AHA/AATS/ACR/ASA/SCA/SCAI/SIR/STS/SVM Guidelines for the Diagnosis and Management of Patients with Thoracic Aortic Disease. Circulation. 2010; 121: Z610-R604. Aortic aneurysm NOS (ICD10-I71.9) 3. No acute findings in the chest. Electronically Signed   By: Kennith Center M.D.   On: 03/22/2022 09:19    Assessment & Plan:   Gastroesophageal reflux disease without esophagitis- Sx's are well controlled. -     CBC with Differential/Platelet; Future -     Basic metabolic panel; Future  Crohn's disease without complication, unspecified gastrointestinal tract location (HCC)- In remission on mesalamine.  Hyperlipidemia LDL goal <100- LDL goal achieved. Doing well on the statin  -     Lipid panel; Future -     Hepatic function panel; Future -     Basic metabolic panel; Future -     Atorvastatin Calcium; Take 1 tablet (20 mg total) by mouth daily.  Dispense: 90 tablet; Refill: 1  Encounter for general adult medical examination with abnormal findings- Exam completed, labs reviewed, vaccines reviewed, cancer screenings addressed, pt ed material was given.  -     PSA; Future  Dysuria -     Urinalysis, Routine w reflex microscopic; Future -     CULTURE, URINE COMPREHENSIVE; Future  Acute prostatitis -     Sulfamethoxazole-Trimethoprim; Take 1 tablet by mouth 2 (two) times daily for 21 days.  Dispense: 42 tablet; Refill: 0     Follow-up: Return in about 6 months (around 07/26/2023).  Sanda Linger, MD

## 2023-01-27 MED ORDER — SULFAMETHOXAZOLE-TRIMETHOPRIM 800-160 MG PO TABS: 1.0000 | ORAL_TABLET | Freq: Two times a day (BID) | ORAL | 0 refills | Status: AC

## 2023-01-27 MED ORDER — ATORVASTATIN CALCIUM 20 MG PO TABS: 20.0000 mg | ORAL_TABLET | Freq: Every day | ORAL | 1 refills | Status: AC

## 2023-01-28 LAB — CULTURE, URINE COMPREHENSIVE: RESULT:: NO GROWTH

## 2023-02-24 ENCOUNTER — Other Ambulatory Visit: Payer: Self-pay

## 2023-02-24 MED ORDER — MESALAMINE 800 MG PO TBEC: DELAYED_RELEASE_TABLET | ORAL | 0 refills | Status: DC

## 2023-03-16 ENCOUNTER — Other Ambulatory Visit: Payer: Self-pay | Admitting: Internal Medicine

## 2023-03-16 ENCOUNTER — Telehealth: Payer: Self-pay | Admitting: Internal Medicine

## 2023-03-16 DIAGNOSIS — I7121 Aneurysm of the ascending aorta, without rupture: Secondary | ICD-10-CM

## 2023-03-16 NOTE — Telephone Encounter (Signed)
-----   Message from Sanda Linger sent at 03/22/2022 10:01 AM EDT ----- Regarding: aTAA recheck

## 2023-05-27 ENCOUNTER — Other Ambulatory Visit: Payer: Self-pay | Admitting: *Deleted

## 2023-05-27 MED ORDER — MESALAMINE 800 MG PO TBEC: DELAYED_RELEASE_TABLET | ORAL | 0 refills | Status: DC

## 2023-06-17 ENCOUNTER — Telehealth: Payer: Self-pay | Admitting: Internal Medicine

## 2023-06-27 NOTE — Telephone Encounter (Signed)
 Daniel Allen

## 2023-07-18 ENCOUNTER — Telehealth: Payer: Self-pay | Admitting: Internal Medicine

## 2023-07-18 NOTE — Telephone Encounter (Signed)
-----   Message from Sanda Linger sent at 03/22/2022 10:01 AM EDT ----- Regarding: aTAA recheck

## 2023-07-22 ENCOUNTER — Telehealth: Payer: Self-pay | Admitting: Internal Medicine

## 2023-07-22 NOTE — Telephone Encounter (Signed)
 done

## 2023-08-10 ENCOUNTER — Ambulatory Visit
Admission: RE | Admit: 2023-08-10 | Discharge: 2023-08-10 | Disposition: A | Discharge status: Home / Self Care | Source: Ambulatory Visit | Attending: Internal Medicine | Admitting: Internal Medicine

## 2023-08-10 DIAGNOSIS — I77810 Thoracic aortic ectasia: Secondary | ICD-10-CM | POA: Diagnosis not present

## 2023-08-10 DIAGNOSIS — I7121 Aneurysm of the ascending aorta, without rupture: Secondary | ICD-10-CM

## 2023-08-10 DIAGNOSIS — I2510 Atherosclerotic heart disease of native coronary artery without angina pectoris: Secondary | ICD-10-CM | POA: Diagnosis not present

## 2023-08-10 MED ORDER — IOPAMIDOL (ISOVUE-370) INJECTION 76%
75.0000 mL | Freq: Once | INTRAVENOUS | Status: AC | PRN
  Administered 2023-08-10: 75 mL via INTRAVENOUS

## 2023-08-15 ENCOUNTER — Ambulatory Visit: Payer: Self-pay | Admitting: Internal Medicine

## 2023-08-22 DIAGNOSIS — E785 Hyperlipidemia, unspecified: Secondary | ICD-10-CM | POA: Diagnosis not present

## 2023-08-22 DIAGNOSIS — R7301 Impaired fasting glucose: Secondary | ICD-10-CM | POA: Diagnosis not present

## 2023-08-22 DIAGNOSIS — I7121 Aneurysm of the ascending aorta, without rupture: Secondary | ICD-10-CM | POA: Diagnosis not present

## 2023-08-22 DIAGNOSIS — Z719 Counseling, unspecified: Secondary | ICD-10-CM | POA: Diagnosis not present

## 2023-09-14 ENCOUNTER — Other Ambulatory Visit: Payer: Self-pay

## 2023-09-14 MED ORDER — MESALAMINE 800 MG PO TBEC: DELAYED_RELEASE_TABLET | ORAL | 0 refills | Status: AC

## 2023-10-11 IMAGING — CT CTA CHEST W/ AND/OR W/O CM W/ OR W/O DISSECTION AND GATING
1 of 9 series · 3 of 16 positions shown, 4 images · IV contrast (APPLIED)
Comparison: 12/24/2019

CLINICAL DATA: Thoracic aortic aneurysm

EXAM:
CT ANGIOGRAPHY CHEST WITH CONTRAST
TECHNIQUE: Multidetector CT imaging of the chest was performed using the
standard protocol during bolus administration of intravenous
contrast. Multiplanar CT image reconstructions and MIPs were
obtained to evaluate the vascular anatomy.
CONTRAST:  80mL OMNIPAQUE IOHEXOL 350 MG/ML SOLN

[Series 9: arterial thins · axial · arterial · 0.72mm/px · z∈[+1106,+1274]mm · 3 of 561 slices shown, 4 images]
[im 141/561  soft-tissue]
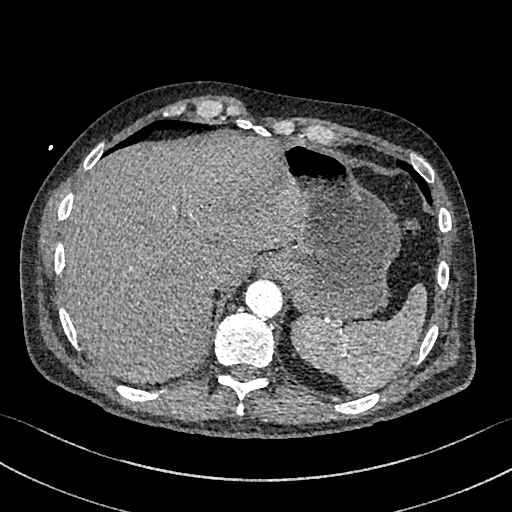
[im 141/561  bone]
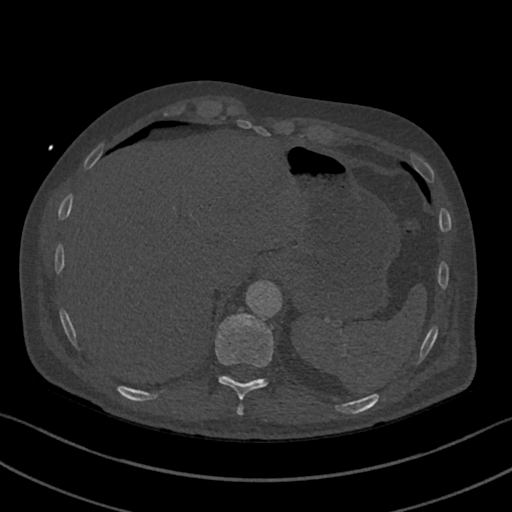
[im 281/561  soft-tissue]
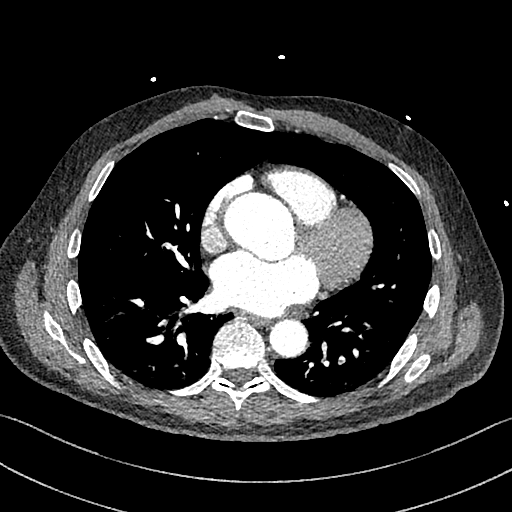
[im 421/561  soft-tissue]
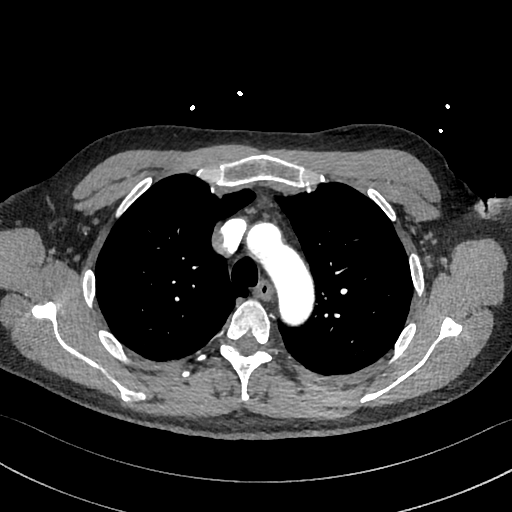

[3 of 16 positions shown; findings below may reference images not displayed]

FINDINGS: Cardiovascular: Initial noncontrast imaging demonstrates no
hyperdense intramural hematoma. Minor fusiform aneurysmal dilatation
of the ascending thoracic aorta measuring 42 mm, unchanged.
Remainder of the thoracic aorta normal in caliber. No acute aortic
process, dissection, or intramural hematoma. Patent 2 vessel arch
anatomy.

No mediastinal hemorrhage or hematoma.

Central pulmonary arteries are patent. No large filling defect or
pulmonary embolus. Normal heart size. No pericardial effusion.

Central venous structures are patent. No Lenise process.
Native coronary atherosclerosis present.

Mediastinum/Nodes: No enlarged mediastinal, hilar, or axillary lymph
nodes. Thyroid gland, trachea, and esophagus demonstrate no
significant findings.

Lungs/Pleura: Minor dependent bibasilar atelectasis. No acute
airspace process, collapse or consolidation. Negative for pneumonia.
No interstitial process or edema.

No pleural abnormality, effusion, or pneumothorax.

Upper Abdomen: No acute abnormality.

Musculoskeletal: No chest wall abnormality. No acute or significant
osseous findings.

Review of the MIP images confirms the above findings.
IMPRESSION: Stable minor aneurysmal dilatation of the ascending thoracic aorta
measuring 42 mm.

Recommend annual imaging followup by CTA or MRA. This recommendation
follows 9171 ACCF/AHA/AATS/ACR/ASA/SCA/[place]/YVELINE/WEATHERLY/GREFG Guidelines
for the Diagnosis and Management of Patients with Thoracic Aortic
Disease. Circulation. 9171; 121: E266-e369. Aortic aneurysm NOS
([id number].Z)

Minor thoracic aortic atherosclerosis. Native coronary
atherosclerosis.

No other acute intrathoracic finding.

Aortic Atherosclerosis (8SWQK-IM4.4).

## 2023-10-17 DIAGNOSIS — I77810 Thoracic aortic ectasia: Secondary | ICD-10-CM | POA: Diagnosis not present

## 2023-10-17 DIAGNOSIS — I2510 Atherosclerotic heart disease of native coronary artery without angina pectoris: Secondary | ICD-10-CM | POA: Diagnosis not present

## 2023-10-17 DIAGNOSIS — I700 Atherosclerosis of aorta: Secondary | ICD-10-CM | POA: Diagnosis not present

## 2023-10-17 DIAGNOSIS — R079 Chest pain, unspecified: Secondary | ICD-10-CM | POA: Diagnosis not present

## 2023-10-17 DIAGNOSIS — R072 Precordial pain: Secondary | ICD-10-CM | POA: Diagnosis not present

## 2023-11-07 DIAGNOSIS — E789 Disorder of lipoprotein metabolism, unspecified: Secondary | ICD-10-CM | POA: Diagnosis not present

## 2023-11-07 DIAGNOSIS — R931 Abnormal findings on diagnostic imaging of heart and coronary circulation: Secondary | ICD-10-CM | POA: Diagnosis not present

## 2023-11-16 DIAGNOSIS — Z85828 Personal history of other malignant neoplasm of skin: Secondary | ICD-10-CM | POA: Diagnosis not present

## 2023-11-16 DIAGNOSIS — L814 Other melanin hyperpigmentation: Secondary | ICD-10-CM | POA: Diagnosis not present

## 2023-11-16 DIAGNOSIS — D225 Melanocytic nevi of trunk: Secondary | ICD-10-CM | POA: Diagnosis not present

## 2023-11-22 DIAGNOSIS — K219 Gastro-esophageal reflux disease without esophagitis: Secondary | ICD-10-CM | POA: Diagnosis not present

## 2023-11-22 DIAGNOSIS — R931 Abnormal findings on diagnostic imaging of heart and coronary circulation: Secondary | ICD-10-CM | POA: Diagnosis not present

## 2023-11-22 DIAGNOSIS — I2510 Atherosclerotic heart disease of native coronary artery without angina pectoris: Secondary | ICD-10-CM | POA: Diagnosis not present

## 2023-11-22 DIAGNOSIS — I10 Essential (primary) hypertension: Secondary | ICD-10-CM | POA: Diagnosis not present

## 2023-11-22 DIAGNOSIS — I7121 Aneurysm of the ascending aorta, without rupture: Secondary | ICD-10-CM | POA: Diagnosis not present

## 2023-12-19 ENCOUNTER — Other Ambulatory Visit: Payer: Self-pay
